# Patient Record
Sex: Female | Born: 1975 | Race: White | Hispanic: No | Smoking: Never smoker
Health system: Southern US, Community
[De-identification: ages and names within clinical notes are randomized; demographics above are authoritative.]

## PROBLEM LIST (undated history)

## (undated) DIAGNOSIS — F419 Anxiety disorder, unspecified: Secondary | ICD-10-CM

## (undated) DIAGNOSIS — R5381 Other malaise: Secondary | ICD-10-CM

## (undated) DIAGNOSIS — R002 Palpitations: Secondary | ICD-10-CM

## (undated) DIAGNOSIS — C4490 Unspecified malignant neoplasm of skin, unspecified: Secondary | ICD-10-CM

## (undated) DIAGNOSIS — R238 Other skin changes: Secondary | ICD-10-CM

## (undated) DIAGNOSIS — IMO0002 Reserved for concepts with insufficient information to code with codable children: Secondary | ICD-10-CM

## (undated) DIAGNOSIS — I7300 Raynaud's syndrome without gangrene: Secondary | ICD-10-CM

## (undated) DIAGNOSIS — O00201 Right ovarian pregnancy without intrauterine pregnancy: Secondary | ICD-10-CM

## (undated) DIAGNOSIS — R569 Unspecified convulsions: Secondary | ICD-10-CM

## (undated) DIAGNOSIS — R5383 Other fatigue: Secondary | ICD-10-CM

## (undated) DIAGNOSIS — M329 Systemic lupus erythematosus, unspecified: Secondary | ICD-10-CM

## (undated) HISTORY — DX: Unspecified malignant neoplasm of skin, unspecified: C44.90

## (undated) HISTORY — PX: WISDOM TOOTH EXTRACTION: SHX21

## (undated) HISTORY — PX: TUBAL LIGATION: SHX77

## (undated) HISTORY — DX: Right ovarian pregnancy without intrauterine pregnancy: O00.201

## (undated) HISTORY — DX: Other skin changes: R23.8

## (undated) HISTORY — DX: Other malaise: R53.83

## (undated) HISTORY — DX: Raynaud's syndrome without gangrene: I73.00

## (undated) HISTORY — DX: Other malaise: R53.81

## (undated) HISTORY — DX: Unspecified convulsions: R56.9

## (undated) HISTORY — PX: ABLATION: SHX5711

## (undated) HISTORY — DX: Anxiety disorder, unspecified: F41.9

## (undated) HISTORY — DX: Palpitations: R00.2

## (undated) HISTORY — DX: Systemic lupus erythematosus, unspecified: M32.9

## (undated) HISTORY — PX: STERIOD INJECTION: SHX5046

## (undated) HISTORY — DX: Reserved for concepts with insufficient information to code with codable children: IMO0002

## (undated) HISTORY — PX: OTHER SURGICAL HISTORY: SHX169

---

## 1998-08-01 HISTORY — PX: WISDOM TOOTH EXTRACTION: SHX21

## 2008-08-01 HISTORY — PX: ABLATION: SHX5711

## 2010-08-01 HISTORY — PX: TUBAL LIGATION: SHX77

## 2012-02-22 ENCOUNTER — Other Ambulatory Visit (HOSPITAL_COMMUNITY)
Admission: RE | Admit: 2012-02-22 | Discharge: 2012-02-22 | Disposition: A | Discharge status: Home / Self Care | Payer: BC Managed Care – PPO | Source: Ambulatory Visit | Attending: Obstetrics & Gynecology | Admitting: Obstetrics & Gynecology

## 2012-02-22 DIAGNOSIS — Z124 Encounter for screening for malignant neoplasm of cervix: Secondary | ICD-10-CM | POA: Insufficient documentation

## 2012-02-22 DIAGNOSIS — Z1151 Encounter for screening for human papillomavirus (HPV): Secondary | ICD-10-CM | POA: Insufficient documentation

## 2012-02-22 DIAGNOSIS — Z113 Encounter for screening for infections with a predominantly sexual mode of transmission: Secondary | ICD-10-CM | POA: Insufficient documentation

## 2012-04-11 DIAGNOSIS — Z9889 Other specified postprocedural states: Secondary | ICD-10-CM | POA: Insufficient documentation

## 2012-04-11 DIAGNOSIS — Z9851 Tubal ligation status: Secondary | ICD-10-CM | POA: Insufficient documentation

## 2013-09-25 ENCOUNTER — Ambulatory Visit: Payer: BC Managed Care – PPO | Admitting: Cardiovascular Disease

## 2013-10-04 ENCOUNTER — Encounter: Payer: Self-pay | Admitting: Cardiovascular Disease

## 2013-10-04 ENCOUNTER — Ambulatory Visit (INDEPENDENT_AMBULATORY_CARE_PROVIDER_SITE_OTHER): Payer: 59 | Admitting: Cardiovascular Disease

## 2013-10-04 ENCOUNTER — Encounter (INDEPENDENT_AMBULATORY_CARE_PROVIDER_SITE_OTHER): Payer: Self-pay

## 2013-10-04 ENCOUNTER — Encounter: Payer: Self-pay | Admitting: *Deleted

## 2013-10-04 VITALS — BP 118/68 | HR 64 | Ht 69.0 in | Wt 162.0 lb

## 2013-10-04 DIAGNOSIS — R002 Palpitations: Secondary | ICD-10-CM | POA: Insufficient documentation

## 2013-10-04 DIAGNOSIS — I7300 Raynaud's syndrome without gangrene: Secondary | ICD-10-CM

## 2013-10-04 DIAGNOSIS — R079 Chest pain, unspecified: Secondary | ICD-10-CM

## 2013-10-04 DIAGNOSIS — M329 Systemic lupus erythematosus, unspecified: Secondary | ICD-10-CM

## 2013-10-04 NOTE — Progress Notes (Signed)
Patient ID: Amber Lawrence, female   DOB: Feb 06, 1976, 38 y.o.   MRN: [id number]   38 yo referred for palpitations and atypical chest pain.  She has Raynauds with "poor circulation" in hands Just evaluated by El[facility] Rheum for positive ANA and apparently DSDNA Positive and she was told she has lupus  Started on plaquenil  She has fleeting sharp pains in both sides of chest and shoulder.  Has had for a long time and thought nothing of it Occasionally related to gas.  Palpitations are intermitant not weekly  Flips but no sustained tachycardia.  Denies fever, pleurisy edema.  No ulcers hands but they are sensitive to cold And turn red / white and blue    ROS: Denies fever, malais, weight loss, blurry vision, decreased visual acuity, cough, sputum, SOB, hemoptysis, pleuritic pain, palpitaitons, heartburn, abdominal pain, melena, lower extremity edema, claudication, or rash.  All other systems reviewed and negative   General: Affect appropriate Healthy:  appears stated age 38: normal Neck supple with no adenopathy JVP normal no bruits no thyromegaly Lungs clear with no wheezing and good diaphragmatic motion Heart:  S1/S2 no murmur,rub, gallop or click PMI normal Abdomen: benighn, BS positve, no tenderness, no AAA no bruit.  No HSM or HJR Distal pulses intact with no bruits No edema Neuro non-focal Skin warm and dry No muscular weakness  Medications Current Outpatient Prescriptions  Medication Sig Dispense Refill  . hydroxychloroquine (PLAQUENIL) 200 MG tablet Take 1 tablet by mouth 2 (two) times daily.       No current facility-administered medications for this visit.    Allergies Review of patient's allergies indicates not on file.  Family History: Family History  Problem Relation Age of Onset  . Heart Problems      grandmother    Social History: History   Social History  . Marital Status: Unknown    Spouse Name: N/A    Number of Children: N/A  . Years of Education: N/A    Occupational History  . Not on file.   Social History Main Topics  . Smoking status: Never Smoker   . Smokeless tobacco: Not on file  . Alcohol Use: Yes  . Drug Use: No  . Sexual Activity: Not on file   Other Topics Concern  . Not on file   Social History Narrative  . No narrative on file    Electrocardiogram:  SR rate 64  Normal ECG   Assessment and Plan

## 2013-10-04 NOTE — Patient Instructions (Signed)

## 2013-10-04 NOTE — Assessment & Plan Note (Signed)
Discussed care of hands and wearing gloves  Can consider procardia if needed in future

## 2013-10-04 NOTE — Assessment & Plan Note (Signed)
F/U Rheum started on plaquenil  Follow Cr for nephritis  Echo to r/o pericardial disease

## 2013-10-04 NOTE — Assessment & Plan Note (Signed)
Atypical  No need for ETT with normal ECG  With lupus would get echo to r/o pericardial disease

## 2013-10-22 ENCOUNTER — Ambulatory Visit (HOSPITAL_COMMUNITY): Payer: 59 | Attending: Cardiovascular Disease | Admitting: Radiology

## 2013-10-22 DIAGNOSIS — M329 Systemic lupus erythematosus, unspecified: Secondary | ICD-10-CM

## 2013-10-22 DIAGNOSIS — R002 Palpitations: Secondary | ICD-10-CM | POA: Insufficient documentation

## 2013-10-22 DIAGNOSIS — R072 Precordial pain: Secondary | ICD-10-CM

## 2013-10-22 NOTE — Progress Notes (Signed)
Echocardiogram Performed. 

## 2020-10-26 ENCOUNTER — Telehealth: Payer: Self-pay

## 2020-10-26 NOTE — Telephone Encounter (Signed)
Pt left voicemail asking office to call her back and confirm the date/time of her appt. Returned pt call and confirmed appt date of 3/29 at 10:10 am. Pt expressed understanding.

## 2020-10-27 ENCOUNTER — Other Ambulatory Visit (HOSPITAL_COMMUNITY)
Admission: RE | Admit: 2020-10-27 | Discharge: 2020-10-27 | Disposition: A | Discharge status: Home / Self Care | Payer: 59 | Source: Ambulatory Visit | Attending: Obstetrics and Gynecology | Admitting: Obstetrics and Gynecology

## 2020-10-27 ENCOUNTER — Encounter: Payer: Self-pay | Admitting: Obstetrics and Gynecology

## 2020-10-27 ENCOUNTER — Other Ambulatory Visit: Payer: Self-pay

## 2020-10-27 ENCOUNTER — Ambulatory Visit (INDEPENDENT_AMBULATORY_CARE_PROVIDER_SITE_OTHER): Payer: 59 | Admitting: Obstetrics and Gynecology

## 2020-10-27 VITALS — BP 135/73 | HR 51 | Ht 69.5 in | Wt 169.0 lb

## 2020-10-27 DIAGNOSIS — Z01419 Encounter for gynecological examination (general) (routine) without abnormal findings: Secondary | ICD-10-CM | POA: Diagnosis present

## 2020-10-27 NOTE — Progress Notes (Signed)
GYNECOLOGY ANNUAL PREVENTATIVE CARE ENCOUNTER NOTE  History:     Amber Lawrence is a 45 y.o. G63P2002 female here for a routine annual gynecologic exam.  Current complaints: none.   Denies abnormal vaginal bleeding, discharge, pelvic pain, problems with intercourse or other gynecologic concerns.   Pronouns: She/her/hers  Gynecologic History Patient's last menstrual period was 10/09/2020. Contraception: tubal ligation/ablasion  Last Pap: 2018 Results were: normal with negative HPV Last mammogram: 2022. Results were: normal  Obstetric History OB History  Gravida Para Term Preterm AB Living  2 2 2     2   SAB IAB Ectopic Multiple Live Births               # Outcome Date GA Lbr Len/2nd Weight Sex Delivery Anes PTL Lv  2 Term           1 Term             Past Medical History:  Diagnosis Date  . Ectopic pregnancy of right ovary   . Other malaise and fatigue   . Other symptoms involving skin and integumentary tissues   . Palpitations   . Raynaud's syndrome   . Skin cancer     Past Surgical History:  Procedure Laterality Date  . ABLATION     Uterine  . right salpingectomy    . TUBAL LIGATION    . WISDOM TOOTH EXTRACTION      No current outpatient medications on file prior to visit.   No current facility-administered medications on file prior to visit.    No Known Allergies  Social History:  reports that she has never smoked. She has never used smokeless tobacco. She reports current alcohol use. She reports that she does not use drugs.  Family History  Problem Relation Age of Onset  . Heart Problems Other        grandmother  . Heart disease Paternal Grandfather   . Heart attack Paternal Grandfather     The following portions of the patient's history were reviewed and updated as appropriate: allergies, current medications, past family history, past medical history, past social history, past surgical history and problem list.  Review of Systems Pertinent items  noted in HPI and remainder of comprehensive ROS otherwise negative.  Physical Exam:  BP 135/73   Pulse (!) 51   Ht 5' 9.5" (1.765 m)   Wt 169 lb (76.7 kg)   LMP 10/09/2020   BMI 24.60 kg/m  CONSTITUTIONAL: Well-developed, well-nourished female in no acute distress.  HENT:  Normocephalic, atraumatic, External right and left ear normal.  EYES: Conjunctivae and EOM are normal. Pupils are equal, round, and reactive to light. No scleral icterus.  NECK: Normal range of motion, supple, no masses.  Normal thyroid.  SKIN: Skin is warm and dry. No rash noted. Not diaphoretic. No erythema. No pallor. MUSCULOSKELETAL: Normal range of motion. No tenderness.  No cyanosis, clubbing, or edema. NEUROLOGIC: Alert and oriented to person, place, and time. Normal reflexes, muscle tone coordination.  PSYCHIATRIC: Normal mood and affect. Normal behavior. Normal judgment and thought content. CARDIOVASCULAR: Normal heart rate noted, regular rhythm RESPIRATORY: Clear to auscultation bilaterally. Effort and breath sounds normal, no problems with respiration noted. BREASTS: Symmetric in size. No masses, tenderness, skin changes, nipple drainage, or lymphadenopathy bilaterally. Performed in the presence of a chaperone. ABDOMEN: Soft, no distention noted.  No tenderness, rebound or guarding.  PELVIC: Normal appearing external genitalia and urethral meatus; normal appearing vaginal mucosa and cervix.  No  abnormal discharge noted.  Pap smear obtained.  Normal uterine size, no other palpable masses, no uterine or adnexal tenderness.  Performed in the presence of a chaperone.   Assessment and Plan:    1. Well woman exam with routine gynecological exam  - Cytology - PAP( [place]) - MM Digital Screening; Future  Will follow up results of pap smear and manage accordingly. Mammogram scheduled Routine preventative health maintenance measures emphasized. Please refer to After Visit Summary for other counseling  recommendations.    Andree Golphin, Artist Pais, [place] for [employer], [place]

## 2020-10-28 LAB — CYTOLOGY - PAP
Comment: NEGATIVE
Diagnosis: NEGATIVE
High risk HPV: NEGATIVE

## 2020-11-04 ENCOUNTER — Ambulatory Visit: Payer: 59

## 2020-11-12 ENCOUNTER — Other Ambulatory Visit: Payer: Self-pay | Admitting: Obstetrics and Gynecology

## 2020-11-12 ENCOUNTER — Ambulatory Visit (INDEPENDENT_AMBULATORY_CARE_PROVIDER_SITE_OTHER): Payer: 59

## 2020-11-12 ENCOUNTER — Other Ambulatory Visit: Payer: Self-pay

## 2020-11-12 DIAGNOSIS — Z1231 Encounter for screening mammogram for malignant neoplasm of breast: Secondary | ICD-10-CM

## 2020-11-12 DIAGNOSIS — Z01419 Encounter for gynecological examination (general) (routine) without abnormal findings: Secondary | ICD-10-CM

## 2020-11-15 ENCOUNTER — Other Ambulatory Visit: Payer: Self-pay | Admitting: Obstetrics and Gynecology

## 2020-11-15 DIAGNOSIS — R928 Other abnormal and inconclusive findings on diagnostic imaging of breast: Secondary | ICD-10-CM

## 2020-11-15 NOTE — Progress Notes (Signed)
Hi,  Korey had an abnormal screening MAMMO and they recommended further evaluation with a diagnostic mammogram. I have placed the order. Can you see that she gets this scheduled?    Thank you so much,  Anderson Malta, NP

## 2020-11-16 ENCOUNTER — Other Ambulatory Visit: Payer: Self-pay | Admitting: Obstetrics and Gynecology

## 2020-11-16 DIAGNOSIS — R928 Other abnormal and inconclusive findings on diagnostic imaging of breast: Secondary | ICD-10-CM

## 2020-12-07 ENCOUNTER — Ambulatory Visit: Payer: 59

## 2020-12-07 ENCOUNTER — Ambulatory Visit
Admission: RE | Admit: 2020-12-07 | Discharge: 2020-12-07 | Disposition: A | Discharge status: Home / Self Care | Payer: 59 | Source: Ambulatory Visit | Attending: Obstetrics and Gynecology | Admitting: Obstetrics and Gynecology

## 2020-12-07 ENCOUNTER — Other Ambulatory Visit: Payer: Self-pay | Admitting: Obstetrics and Gynecology

## 2020-12-07 ENCOUNTER — Other Ambulatory Visit: Payer: Self-pay

## 2020-12-07 DIAGNOSIS — R921 Mammographic calcification found on diagnostic imaging of breast: Secondary | ICD-10-CM

## 2020-12-07 DIAGNOSIS — R928 Other abnormal and inconclusive findings on diagnostic imaging of breast: Secondary | ICD-10-CM

## 2021-03-12 ENCOUNTER — Other Ambulatory Visit: Payer: Self-pay

## 2021-03-12 ENCOUNTER — Encounter: Payer: Self-pay | Admitting: Medical-Surgical

## 2021-03-12 ENCOUNTER — Ambulatory Visit: Payer: 59 | Admitting: Medical-Surgical

## 2021-03-12 VITALS — BP 128/70 | HR 53 | Resp 20 | Ht 69.5 in | Wt 162.0 lb

## 2021-03-12 DIAGNOSIS — F430 Acute stress reaction: Secondary | ICD-10-CM

## 2021-03-12 DIAGNOSIS — F902 Attention-deficit hyperactivity disorder, combined type: Secondary | ICD-10-CM

## 2021-03-12 DIAGNOSIS — G4700 Insomnia, unspecified: Secondary | ICD-10-CM

## 2021-03-12 DIAGNOSIS — Z1329 Encounter for screening for other suspected endocrine disorder: Secondary | ICD-10-CM | POA: Diagnosis not present

## 2021-03-12 DIAGNOSIS — Z0000 Encounter for general adult medical examination without abnormal findings: Secondary | ICD-10-CM | POA: Diagnosis not present

## 2021-03-12 DIAGNOSIS — Z7689 Persons encountering health services in other specified circumstances: Secondary | ICD-10-CM | POA: Diagnosis not present

## 2021-03-12 DIAGNOSIS — Z113 Encounter for screening for infections with a predominantly sexual mode of transmission: Secondary | ICD-10-CM

## 2021-03-12 DIAGNOSIS — R5383 Other fatigue: Secondary | ICD-10-CM | POA: Diagnosis not present

## 2021-03-12 MED ORDER — LISDEXAMFETAMINE DIMESYLATE 30 MG PO CAPS: 30.0000 mg | ORAL_CAPSULE | Freq: Every day | ORAL | 0 refills | Status: DC

## 2021-03-12 MED ORDER — QUETIAPINE FUMARATE 25 MG PO TABS: 25.0000 mg | ORAL_TABLET | Freq: Every day | ORAL | 0 refills | Status: DC

## 2021-03-12 NOTE — Progress Notes (Signed)
New Patient Office Visit  Subjective:  Patient ID: Amber Lawrence, female    DOB: 09-29-1975  Age: 45 y.o. MRN: [id number]  CC: No chief complaint on file.   HPI Amber Lawrence presents to establish care.  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  ADHD-was diagnosed in childhood and has several years where she was medicated.  She is not sure what her medication was at the time but as she grew older, she began to learn to manage with lifestyle modifications.  She has not been treated for several years but has recently started a new job that is very sedentary.  Admits to trying a friend's Vyvanse a couple of times and it really helped her focus and get her work done.  She is very interested in getting restarted on a medication to help manage her symptoms.  Lupus/fatigue-she was diagnosed with lupus in 2016 and was medicated for a short period of time.  She did not want to stay on medications long-term so she worked to increase her exercise and modify her diet.  After 9 months of lifestyle modifications, she went back and was told that she no longer needed medication.  Over the past months she has noticed that her fatigue has gotten worse and is "bone deep".  She would like to have blood work done to make sure all is well and this is simply a response to her current lifestyle changes and stress levels.  Mood/insomnia-is currently dealing with quite a lot of stress in her life.  She has dealt for many years with custody issues when she split from her first husband.  About the time those issues got settled, her current husband of 10 years decided he wanted out of their marriage.  She is now dealing with being a single mom and is struggling to make it work.  She has new job which has caused quite a bit of stress with the change.  She has been doing counseling for years which has been very helpful but with her new schedule, she has difficulty finding time to get in there for appointments.   She has never taken antidepressants and is very hesitant to consider starting one.  She is having difficulty sleeping at night where she cannot shut her mind off.  She wakes every couple of hours and has difficulty getting back to sleep due to racing thoughts.  Would like to be tested for STIs as she recently found out that her husband had been seeing someone.  Past Medical History:  Diagnosis Date   Ectopic pregnancy of right ovary    Lupus ([place])    Other malaise and fatigue    Other symptoms involving skin and integumentary tissues    Palpitations    Raynaud's syndrome    Skin cancer     Past Surgical History:  Procedure Laterality Date   ABLATION     Uterine   right salpingectomy     TUBAL LIGATION     WISDOM TOOTH EXTRACTION      Family History  Problem Relation Age of Onset   Heart Problems Other        grandmother   Heart disease Paternal Grandfather    Heart attack Paternal Grandfather     Social History   Socioeconomic History   Marital status: Legally Separated    Spouse name: Not on file   Number of children: Not on file   Years of education: Not on file  Highest education level: Not on file  Occupational History   Not on file  Tobacco Use   Smoking status: Never   Smokeless tobacco: Never  Vaping Use   Vaping Use: Never used  Substance and Sexual Activity   Alcohol use: Yes   Drug use: No   Sexual activity: Not Currently    Birth control/protection: Surgical  Other Topics Concern   Not on file  Social History Narrative   Not on file   Social Determinants of Health   Financial Resource Strain: Not on file  Food Insecurity: Not on file  Transportation Needs: Not on file  Physical Activity: Not on file  Stress: Not on file  Social Connections: Not on file  Intimate Partner Violence: Not on file    ROS Review of Systems  Objective:   Today's Vitals: BP 128/70 (BP Location: Left Arm, Patient Position: Sitting, Cuff Size: Normal)    Pulse (!) 53   Resp 20   Ht 5' 9.5" (1.765 m)   Wt 162 lb (73.5 kg)   SpO2 98%   BMI 23.58 kg/m   Physical Exam Vitals reviewed.  Constitutional:      General: She is not in acute distress.    Appearance: Normal appearance. She is normal weight. She is not ill-appearing.  HENT:     Head: Normocephalic and atraumatic.  Cardiovascular:     Rate and Rhythm: Normal rate and regular rhythm.     Pulses: Normal pulses.     Heart sounds: Normal heart sounds. No murmur heard.   No friction rub. No gallop.  Pulmonary:     Effort: Pulmonary effort is normal. No respiratory distress.     Breath sounds: Normal breath sounds. No wheezing.  Skin:    General: Skin is warm and dry.  Neurological:     Mental Status: She is alert and oriented to person, place, and time.  Psychiatric:        Attention and Perception: Attention and perception normal.        Mood and Affect: Mood normal.        Speech: Speech normal.        Behavior: Behavior normal. Behavior is cooperative.        Thought Content: Thought content normal.        Cognition and Memory: Cognition and memory normal.        Judgment: Judgment normal.    Assessment & Plan:   1. Encounter to establish care Reviewed available information and discussed healthcare concerns with patient.  2. Preventative health care CBC with differential, CMP, and lipid panel today. - CBC with Differential/Platelet - COMPLETE METABOLIC PANEL WITH GFR - Lipid panel  3. Thyroid disorder screen Checking TSH today. - TSH  4. Fatigue, unspecified type Checking iron panel, vitamin D, vitamin B12, and folate panel today. - Fe+TIBC+Fer - VITAMIN D 25 Hydroxy (Vit-D Deficiency, Fractures) - B12 and Folate Panel  5. Routine screening for STI (sexually transmitted infection) STI testing as below. - HIV Antibody (routine testing w rflx) - Hepatitis C Antibody - Hepatitis B surface antigen - RPR - C. trachomatis/N. gonorrhoeae RNA - WET PREP FOR  [place], YEAST, CLUE  6. Attention deficit hyperactivity disorder (ADHD), combined type Would like to have her records for formal diagnosis but since this was diagnosed during her early school years, this is likely not going to be available.  She was treated in the past with ADHD medications with good results so we are starting  Vyvanse 30 mg daily.  7. Mixed insomnia 8. Stress reaction Unfortunately, a lot of her anxieties/stress are related to situational issues.  I do think that she would benefit from getting better sleep at night as well as some mood stabilization.  Starting Seroquel 25 mg at bedtime.  Outpatient Encounter Medications as of 03/12/2021  Medication Sig   lisdexamfetamine (VYVANSE) 30 MG capsule Take 1 capsule (30 mg total) by mouth daily.   QUEtiapine (SEROQUEL) 25 MG tablet Take 1 tablet (25 mg total) by mouth at bedtime.   No facility-administered encounter medications on file as of 03/12/2021.    Follow-up: Return in about 4 weeks (around 04/09/2021) for ADHD/mood/insomnia follow up.   Clearnce Sorrel, DNP, APRN, FNP-BC [facility]

## 2021-03-13 LAB — CBC WITH DIFFERENTIAL/PLATELET
Absolute Monocytes: 574 cells/uL (ref 200–950)
Basophils Absolute: 59 cells/uL (ref 0–200)
Basophils Relative: 0.9 %
Eosinophils Absolute: 158 cells/uL (ref 15–500)
Eosinophils Relative: 2.4 %
HCT: 45.1 % — ABNORMAL HIGH (ref 35.0–45.0)
Hemoglobin: 14.6 g/dL (ref 11.7–15.5)
Lymphs Abs: 2099 cells/uL (ref 850–3900)
MCH: 30.6 pg (ref 27.0–33.0)
MCHC: 32.4 g/dL (ref 32.0–36.0)
MCV: 94.5 fL (ref 80.0–100.0)
MPV: 11.2 fL (ref 7.5–12.5)
Monocytes Relative: 8.7 %
Neutro Abs: 3709 cells/uL (ref 1500–7800)
Neutrophils Relative %: 56.2 %
Platelets: 222 10*3/uL (ref 140–400)
RBC: 4.77 10*6/uL (ref 3.80–5.10)
RDW: 11.6 % (ref 11.0–15.0)
Total Lymphocyte: 31.8 %
WBC: 6.6 10*3/uL (ref 3.8–10.8)

## 2021-03-13 LAB — COMPLETE METABOLIC PANEL WITH GFR
AG Ratio: 1.6 (calc) (ref 1.0–2.5)
ALT: 18 U/L (ref 6–29)
AST: 19 U/L (ref 10–30)
Albumin: 4.5 g/dL (ref 3.6–5.1)
Alkaline phosphatase (APISO): 40 U/L (ref 31–125)
BUN/Creatinine Ratio: 28 (calc) — ABNORMAL HIGH (ref 6–22)
BUN: 26 mg/dL — ABNORMAL HIGH (ref 7–25)
CO2: 26 mmol/L (ref 20–32)
Calcium: 9.7 mg/dL (ref 8.6–10.2)
Chloride: 102 mmol/L (ref 98–110)
Creat: 0.92 mg/dL (ref 0.50–0.99)
Globulin: 2.9 g/dL (calc) (ref 1.9–3.7)
Glucose, Bld: 85 mg/dL (ref 65–99)
Potassium: 4.1 mmol/L (ref 3.5–5.3)
Sodium: 137 mmol/L (ref 135–146)
Total Bilirubin: 0.5 mg/dL (ref 0.2–1.2)
Total Protein: 7.4 g/dL (ref 6.1–8.1)
eGFR: 79 mL/min/{1.73_m2} (ref >=60)

## 2021-03-13 LAB — WET PREP FOR TRICH, YEAST, CLUE
MICRO NUMBER:: 12236533
Specimen Quality: ADEQUATE

## 2021-03-13 LAB — LIPID PANEL
Cholesterol: 197 mg/dL (ref <200)
HDL: 88 mg/dL (ref >=50)
LDL Cholesterol (Calc): 98 mg/dL (calc)
Non-HDL Cholesterol (Calc): 109 mg/dL (calc) (ref <130)
Total CHOL/HDL Ratio: 2.2 (calc) (ref <5.0)
Triglycerides: 36 mg/dL (ref <150)

## 2021-03-13 LAB — TSH: TSH: 1.61 mIU/L

## 2021-03-14 ENCOUNTER — Other Ambulatory Visit: Payer: Self-pay | Admitting: Medical-Surgical

## 2021-03-14 MED ORDER — METRONIDAZOLE 500 MG PO TABS: 500.0000 mg | ORAL_TABLET | Freq: Two times a day (BID) | ORAL | 0 refills | Status: AC

## 2021-03-15 LAB — C. TRACHOMATIS/N. GONORRHOEAE RNA
C. trachomatis RNA, TMA: NOT DETECTED
N. gonorrhoeae RNA, TMA: NOT DETECTED

## 2021-03-15 LAB — HIV ANTIBODY (ROUTINE TESTING W REFLEX): HIV 1&2 Ab, 4th Generation: NONREACTIVE

## 2021-03-15 LAB — IRON,TIBC AND FERRITIN PANEL
%SAT: 28 % (calc) (ref 16–45)
Ferritin: 153 ng/mL (ref 16–232)
Iron: 91 ug/dL (ref 40–190)
TIBC: 323 mcg/dL (calc) (ref 250–450)

## 2021-03-15 LAB — VITAMIN D 25 HYDROXY (VIT D DEFICIENCY, FRACTURES): Vit D, 25-Hydroxy: 43 ng/mL (ref 30–100)

## 2021-03-15 LAB — HEPATITIS B SURFACE ANTIGEN: Hepatitis B Surface Ag: NONREACTIVE

## 2021-03-15 LAB — RPR: RPR Ser Ql: NONREACTIVE

## 2021-03-15 LAB — B12 AND FOLATE PANEL
Folate: 11.7 ng/mL
Vitamin B-12: 583 pg/mL (ref 200–1100)

## 2021-03-15 LAB — HEPATITIS C ANTIBODY
Hepatitis C Ab: NONREACTIVE
SIGNAL TO CUT-OFF: 0.01 (ref <1.00)

## 2021-04-09 ENCOUNTER — Telehealth: Payer: 59 | Admitting: Medical-Surgical

## 2021-04-10 ENCOUNTER — Other Ambulatory Visit: Payer: Self-pay | Admitting: Medical-Surgical

## 2021-04-23 ENCOUNTER — Encounter: Payer: Self-pay | Admitting: Medical-Surgical

## 2021-04-23 ENCOUNTER — Other Ambulatory Visit: Payer: Self-pay | Admitting: Medical-Surgical

## 2021-04-23 MED ORDER — LISDEXAMFETAMINE DIMESYLATE 30 MG PO CAPS: 30.0000 mg | ORAL_CAPSULE | Freq: Every day | ORAL | 0 refills | Status: DC

## 2021-04-26 ENCOUNTER — Other Ambulatory Visit: Payer: Self-pay | Admitting: Medical-Surgical

## 2021-04-27 ENCOUNTER — Encounter: Payer: Self-pay | Admitting: Medical-Surgical

## 2021-04-27 NOTE — Telephone Encounter (Signed)
Please refuse this RX refill.  Joy sent it to the pharmacy on Friday.  I do not have security to deny the refill.  Charyl Bigger, CMA

## 2021-05-05 ENCOUNTER — Telehealth (INDEPENDENT_AMBULATORY_CARE_PROVIDER_SITE_OTHER): Payer: 59 | Admitting: Medical-Surgical

## 2021-05-05 ENCOUNTER — Encounter: Payer: Self-pay | Admitting: Medical-Surgical

## 2021-05-05 VITALS — Wt 162.0 lb

## 2021-05-05 DIAGNOSIS — G4700 Insomnia, unspecified: Secondary | ICD-10-CM | POA: Diagnosis not present

## 2021-05-05 DIAGNOSIS — F902 Attention-deficit hyperactivity disorder, combined type: Secondary | ICD-10-CM

## 2021-05-05 DIAGNOSIS — R1084 Generalized abdominal pain: Secondary | ICD-10-CM

## 2021-05-05 MED ORDER — LISDEXAMFETAMINE DIMESYLATE 40 MG PO CAPS: 40.0000 mg | ORAL_CAPSULE | ORAL | 0 refills | Status: DC

## 2021-05-05 NOTE — Progress Notes (Signed)
Virtual Visit via Video Note  I connected with Amber Lawrence on 05/05/21 at  2:00 PM EDT by a video enabled telemedicine application and verified that I am speaking with the correct person using two identifiers.   I discussed the limitations of evaluation and management by telemedicine and the availability of in person appointments. The patient expressed understanding and agreed to proceed.  Patient location: home Provider locations: office  Subjective:    CC: ADHD follow-up, good health questions  HPI: Pleasant 45 year old female presenting via [place] video visit for the following:  ADHD-taking Vyvanse 30 mg daily, tolerating well without side effects.  No significant changes in appetite or weight.  Sleeping well with the help of Seroquel.  Feels that the 30 mg dose does help but her focus is not quite where she would like it to be.  Wonders if she would do better with a little bit of a higher dose.  Has had a history of some intermittent abdominal pain and has recently come to the conclusion that this is not a normal part of GI activity.  Notes worsening issues when it comes to eating things with red sauce, tomatoes, red meat, and acidic foods.  This happens intermittently and not every time she gets these foods.  She has bowel movements on average twice a week or less but about a month ago, she started a probiotic as well as using MiraLAX daily as needed.  Now she is having 4-5 bowel movements per week which has been very helpful.  She wants to make sure that taking MiraLAX and the probiotic is okay or if there are other recommendations that may be beneficial.  Past medical history, Surgical history, Family history not pertinant except as noted below, Social history, Allergies, and medications have been entered into the medical record, reviewed, and corrections made.   Review of Systems: See HPI for pertinent positives and negatives.   Objective:    General: Speaking clearly in complete  sentences without any shortness of breath.  Alert and oriented x3.  Normal judgment. No apparent acute distress.  Impression and Recommendations:    1. Attention deficit hyperactivity disorder (ADHD), combined type Increasing Vyvanse to 40 mg daily.  Canceling out her 30 mg prescription at the pharmacy.  Advised patient to try the 40 mg daily for the next few weeks and I will check in with her via MyChart.  If she does well on this dose, I will be happy to send in another 12-month supply so we can follow-up in 3 months as planned.  2. Mixed insomnia Continue Seroquel 25 mg nightly as needed.  3. Abdominal discomfort, generalized Suspect there is a bit of reflux is causing her sensitivities to certain foods.  Avoid known food triggers and if necessary, okay to trial over-the-counter antacids.  Recommend continuing probiotic.  Okay to use MiraLAX 17 g daily as needed.  Consider adding a fiber supplements on a daily basis as this may help with constipation and reduce the need for MiraLAX.  I discussed the assessment and treatment plan with the patient. The patient was provided an opportunity to ask questions and all were answered. The patient agreed with the plan and demonstrated an understanding of the instructions.   The patient was advised to call back or seek an in-person evaluation if the symptoms worsen or if the condition fails to improve as anticipated.  20 minutes of non-face-to-face time was provided during this encounter.  Return in about 3 months (around 08/05/2021)  for ADHD follow up.  Clearnce Sorrel, DNP, APRN, FNP-BC [facility]

## 2021-05-05 NOTE — Progress Notes (Signed)
Gut health, taking daily probiotic, Miralax Seroquel helping a lot Vyvanse doing ok, would like to up dose

## 2021-05-07 ENCOUNTER — Other Ambulatory Visit: Payer: Self-pay | Admitting: Medical-Surgical

## 2021-05-12 ENCOUNTER — Encounter: Payer: Self-pay | Admitting: Medical-Surgical

## 2021-05-12 MED ORDER — QUETIAPINE FUMARATE 25 MG PO TABS: 25.0000 mg | ORAL_TABLET | Freq: Every day | ORAL | 0 refills | Status: DC

## 2021-05-25 ENCOUNTER — Telehealth (INDEPENDENT_AMBULATORY_CARE_PROVIDER_SITE_OTHER): Payer: 59 | Admitting: Medical-Surgical

## 2021-05-25 ENCOUNTER — Encounter: Payer: Self-pay | Admitting: Medical-Surgical

## 2021-05-25 DIAGNOSIS — F902 Attention-deficit hyperactivity disorder, combined type: Secondary | ICD-10-CM | POA: Insufficient documentation

## 2021-05-25 MED ORDER — LISDEXAMFETAMINE DIMESYLATE 40 MG PO CAPS: 40.0000 mg | ORAL_CAPSULE | ORAL | 0 refills | Status: DC

## 2021-05-25 NOTE — Progress Notes (Signed)
Virtual Visit via Video Note  I connected with Amber Lawrence on 05/25/21 at  2:00 PM EDT by a video enabled telemedicine application and verified that I am speaking with the correct person using two identifiers.   I discussed the limitations of evaluation and management by telemedicine and the availability of in person appointments. The patient expressed understanding and agreed to proceed.  Patient location: home Provider locations: office  Subjective:    CC: ADHD follow up   HPI: Pleasant 45 year old female presenting via [place] video visit for follow-up on ADHD.  Approximately 4 weeks ago we increased her Vyvanse to 40 mg daily.  She has been taking this as prescribed, tolerating well without side effects.  Has not noticed much of a difference at the 40 mg dose compared to the 30 mg dose.  Feels that it is working well for her and she is staying focused throughout the day.  She does still have some times when she feels like there is room for improvement in her focus but admits that this may be her personality rather than attention deficit related.  Using Seroquel 25 mg at night as needed to help with sleep with good results.  No changes in appetite or weight fluctuations.  Past medical history, Surgical history, Family history not pertinant except as noted below, Social history, Allergies, and medications have been entered into the medical record, reviewed, and corrections made.   Review of Systems: See HPI for pertinent positives and negatives.   Objective:    General: Speaking clearly in complete sentences without any shortness of breath.  Alert and oriented x3.  Normal judgment. No apparent acute distress.  Impression and Recommendations:    1. Attention deficit hyperactivity disorder (ADHD), combined type Continue Vyvanse 40 mg daily.  44-month supply sent to the pharmacy.  If she would like to reevaluate her dose, she is welcome to reach out to me for further recommendations  I  discussed the assessment and treatment plan with the patient. The patient was provided an opportunity to ask questions and all were answered. The patient agreed with the plan and demonstrated an understanding of the instructions.   The patient was advised to call back or seek an in-person evaluation if the symptoms worsen or if the condition fails to improve as anticipated.  20 minutes of non-face-to-face time was provided during this encounter.  Return in about 3 months (around 08/25/2021) for ADHD follow up.  Clearnce Sorrel, DNP, APRN, FNP-BC [facility]

## 2021-07-07 ENCOUNTER — Encounter: Payer: Self-pay | Admitting: Medical-Surgical

## 2021-07-07 ENCOUNTER — Other Ambulatory Visit: Payer: Self-pay | Admitting: Medical-Surgical

## 2021-07-07 MED ORDER — QUETIAPINE FUMARATE 25 MG PO TABS: 25.0000 mg | ORAL_TABLET | Freq: Every day | ORAL | 0 refills | Status: DC

## 2021-07-12 ENCOUNTER — Encounter: Payer: Self-pay | Admitting: Medical-Surgical

## 2021-07-12 ENCOUNTER — Telehealth (INDEPENDENT_AMBULATORY_CARE_PROVIDER_SITE_OTHER): Payer: 59 | Admitting: Medical-Surgical

## 2021-07-12 ENCOUNTER — Other Ambulatory Visit: Payer: Self-pay

## 2021-07-12 DIAGNOSIS — F902 Attention-deficit hyperactivity disorder, combined type: Secondary | ICD-10-CM

## 2021-07-12 MED ORDER — LISDEXAMFETAMINE DIMESYLATE 50 MG PO CAPS: 50.0000 mg | ORAL_CAPSULE | Freq: Every day | ORAL | 0 refills | Status: DC

## 2021-07-12 NOTE — Progress Notes (Signed)
Virtual Visit via Video Note  I connected with Amber Lawrence on 07/12/21 at  3:00 PM EST by a video enabled telemedicine application and verified that I am speaking with the correct person using two identifiers.   I discussed the limitations of evaluation and management by telemedicine and the availability of in person appointments. The patient expressed understanding and agreed to proceed.  Patient location: home Provider locations: office  Subjective:    CC: ADHD follow up  HPI: Pleasant 45 year old female presenting via [place] video visit today for follow-up on ADHD.  She has been doing well on Vyvanse 40 mg daily but notes that she feels like the medication could work just a bit better.  She is interested in increasing the dose to the next level, especially with the holidays coming around.  Does note that the medication causes appetite suppression but has been working around this by eating heavier in the morning and snacking throughout the day to make sure she gets her protein in.  Using Seroquel at night to help with sleep which has been very beneficial.  Past medical history, Surgical history, Family history not pertinant except as noted below, Social history, Allergies, and medications have been entered into the medical record, reviewed, and corrections made.   Review of Systems: See HPI for pertinent positives and negatives.   Objective:    General: Speaking clearly in complete sentences without any shortness of breath.  Alert and oriented x3.  Normal judgment. No apparent acute distress.  Impression and Recommendations:    1. Attention deficit hyperactivity disorder (ADHD), combined type Increasing Vyvanse to 50 mg daily.  I will check in with her via MyChart in the next 3 to 4 weeks to see how the increased dose is working for her. Continue eating regularly. Ok to continue Seroquel for sleep and mood.   I discussed the assessment and treatment plan with the patient. The patient  was provided an opportunity to ask questions and all were answered. The patient agreed with the plan and demonstrated an understanding of the instructions.   The patient was advised to call back or seek an in-person evaluation if the symptoms worsen or if the condition fails to improve as anticipated.  20 minutes of non-face-to-face time was provided during this encounter.  Follow up: Will follow up with her via MyChart in 3-4 weeks.   Clearnce Sorrel, DNP, APRN, FNP-BC [facility]

## 2021-08-05 ENCOUNTER — Encounter: Payer: Self-pay | Admitting: Medical-Surgical

## 2021-08-30 ENCOUNTER — Other Ambulatory Visit: Payer: Self-pay | Admitting: Medical-Surgical

## 2021-08-30 MED ORDER — LISDEXAMFETAMINE DIMESYLATE 50 MG PO CAPS: 50.0000 mg | ORAL_CAPSULE | Freq: Every day | ORAL | 0 refills | Status: DC

## 2021-08-31 ENCOUNTER — Ambulatory Visit (INDEPENDENT_AMBULATORY_CARE_PROVIDER_SITE_OTHER): Payer: BC Managed Care – PPO

## 2021-08-31 ENCOUNTER — Ambulatory Visit: Payer: BC Managed Care – PPO | Admitting: Sports Medicine

## 2021-08-31 ENCOUNTER — Other Ambulatory Visit: Payer: Self-pay

## 2021-08-31 DIAGNOSIS — S6991XA Unspecified injury of right wrist, hand and finger(s), initial encounter: Secondary | ICD-10-CM | POA: Insufficient documentation

## 2021-08-31 DIAGNOSIS — S61212A Laceration without foreign body of right middle finger without damage to nail, initial encounter: Secondary | ICD-10-CM | POA: Diagnosis not present

## 2021-08-31 NOTE — Assessment & Plan Note (Addendum)
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 She has good motion, good strength, no evidence of rotational deformity, flexor digitorum profundus, superficialis and lumbricals intact, extensors intact, collaterals stable. I would like some x-rays, I buddy taped her third and fourth fingers together. Home conditioning given, avoid resistance with the right hand for the next 2 to 3 weeks, return to see me in 3 weeks.

## 2021-08-31 NOTE — Progress Notes (Signed)
° ° °  Procedures performed today:    None.  Independent interpretation of notes and tests performed by another provider:   None.  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 She has good motion, good strength, no evidence of rotational deformity, flexor digitorum profundus, superficialis and lumbricals intact, extensors intact, collaterals stable. I would like some x-rays, I buddy taped her third and fourth fingers together. Home conditioning given, avoid resistance with the right hand for the next 2 to 3 weeks, return to see me in 3 weeks.    ___________________________________________ Gwen Her. Dianah Field, M.D., ABFM., CAQSM. Primary Care and [place] Instructor of [place] of [facility] of Medicine

## 2021-09-01 ENCOUNTER — Encounter: Payer: Self-pay | Admitting: Medical-Surgical

## 2021-09-01 ENCOUNTER — Encounter: Payer: Self-pay | Admitting: Sports Medicine

## 2021-09-13 ENCOUNTER — Telehealth: Payer: BC Managed Care – PPO | Admitting: Medical-Surgical

## 2021-09-13 ENCOUNTER — Encounter: Payer: Self-pay | Admitting: Medical-Surgical

## 2021-09-13 DIAGNOSIS — F902 Attention-deficit hyperactivity disorder, combined type: Secondary | ICD-10-CM

## 2021-09-13 MED ORDER — AMPHETAMINE-DEXTROAMPHET ER 20 MG PO CP24: 20.0000 mg | ORAL_CAPSULE | ORAL | 0 refills | Status: DC

## 2021-09-13 NOTE — Progress Notes (Signed)
Virtual Visit via Video Note  I connected with Amber Lawrence on 09/13/21 at  2:40 PM EST by a video enabled telemedicine application and verified that I am speaking with the correct person using two identifiers.   I discussed the limitations of evaluation and management by telemedicine and the availability of in person appointments. The patient expressed understanding and agreed to proceed.  Patient location: home Provider locations: office  Subjective:    CC: ADHD follow-up  HPI: Pleasant 46 year old female presenting via [place] video visit for ADHD follow-up.  Unfortunately, her insurance has changed and she is no longer able to afford Vyvanse as it is costing her $190 per month.  She is interested in other options to treat her ADHD.  She took Concerta in the past and does not remember any significant side effects.  Thinks it was a 30 mg dose and tolerated it well.  She also tried Adderall 1 time but does not remember if it was helpful at all.  She does know that she did not have any negative side effects from it.  Past medical history, Surgical history, Family history not pertinant except as noted below, Social history, Allergies, and medications have been entered into the medical record, reviewed, and corrections made.   Review of Systems: See HPI for pertinent positives and negatives.   Objective:    General: Speaking clearly in complete sentences without any shortness of breath.  Alert and oriented x3.  Normal judgment. No apparent acute distress.  Impression and Recommendations:    1. Attention deficit hyperactivity disorder (ADHD), combined type Unsure what her insurance will cover at this point.  Advised her to contact her insurance company and let me know what their preferred medication is.  MyChart message received with the requested information.  Discontinue Vyvanse.  Switching to Adderall XR 20 mg daily.  Plan to follow-up in 4 to 6 weeks to evaluate tolerance and effectiveness  of the medication.  May need dose titration.  I discussed the assessment and treatment plan with the patient. The patient was provided an opportunity to ask questions and all were answered. The patient agreed with the plan and demonstrated an understanding of the instructions.   The patient was advised to call back or seek an in-person evaluation if the symptoms worsen or if the condition fails to improve as anticipated.  25 minutes of non-face-to-face time was provided during this encounter.  Return in about 4 weeks (around 10/11/2021) for ADHD follow up.  Clearnce Sorrel, DNP, APRN, FNP-BC [place] [facility]

## 2021-09-14 ENCOUNTER — Encounter: Payer: Self-pay | Admitting: Medical-Surgical

## 2021-09-14 MED ORDER — AMPHETAMINE-DEXTROAMPHET ER 20 MG PO CP24: 20.0000 mg | ORAL_CAPSULE | ORAL | 0 refills | Status: DC

## 2021-09-23 ENCOUNTER — Ambulatory Visit: Payer: BC Managed Care – PPO | Admitting: Sports Medicine

## 2021-09-23 ENCOUNTER — Other Ambulatory Visit: Payer: Self-pay

## 2021-09-23 DIAGNOSIS — S6991XD Unspecified injury of right wrist, hand and finger(s), subsequent encounter: Secondary | ICD-10-CM | POA: Diagnosis not present

## 2021-09-23 NOTE — Assessment & Plan Note (Signed)
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 She can return to see me as needed.

## 2021-09-23 NOTE — Progress Notes (Signed)
° ° °  Procedures performed today:    None.  Independent interpretation of notes and tests performed by another provider:   None.  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 She can return to see me as needed.    ___________________________________________ Gwen Her. Dianah Field, M.D., ABFM., CAQSM. Primary Care and [place] Instructor of [place] of [facility] of Medicine

## 2021-10-14 ENCOUNTER — Other Ambulatory Visit: Payer: Self-pay | Admitting: Medical-Surgical

## 2021-10-15 MED ORDER — AMPHETAMINE-DEXTROAMPHET ER 20 MG PO CP24: 20.0000 mg | ORAL_CAPSULE | ORAL | 0 refills | Status: DC

## 2021-10-21 ENCOUNTER — Encounter: Payer: Self-pay | Admitting: Medical-Surgical

## 2021-11-01 ENCOUNTER — Encounter: Payer: Self-pay | Admitting: Medical-Surgical

## 2021-11-01 ENCOUNTER — Telehealth: Payer: BC Managed Care – PPO | Admitting: Medical-Surgical

## 2021-11-01 DIAGNOSIS — F902 Attention-deficit hyperactivity disorder, combined type: Secondary | ICD-10-CM | POA: Diagnosis not present

## 2021-11-01 MED ORDER — AMPHETAMINE-DEXTROAMPHET ER 30 MG PO CP24: 30.0000 mg | ORAL_CAPSULE | ORAL | 0 refills | Status: DC

## 2021-11-01 NOTE — Progress Notes (Signed)
Virtual Visit via Video Note ? ?I connected with Amber Lawrence on 11/01/21 at  2:40 PM EDT by a video enabled telemedicine application and verified that I am speaking with the correct person using two identifiers. ?  ?I discussed the limitations of evaluation and management by telemedicine and the availability of in person appointments. The patient expressed understanding and agreed to proceed. ? ?Patient location: home ?Provider locations: office ? ?Subjective:   ? ?CC: Adderall dose change? ? ?HPI: ?Pleasant 46 year old female presenting via [place] video visit to discuss current treatment for ADHD. Was  previously treated with Vyvanse which was very helpful. Unfortunately, her insurance coverage changed and the Vyvanse is no  longer affordable. About 6 weeks ago, we switched her to Adderall XR '20mg'$  daily. She has tolerated this well overall but feels that it isn't as effective as the Vyvanse was. Is interested in increasing her dose.  Denies changes in sleeping pattern, appetite, and weight fluctuations. ? ?Past medical history, Surgical history, Family history not pertinant except as noted below, Social history, Allergies, and medications have been entered into the medical record, reviewed, and corrections made.  ? ?Review of Systems: See HPI for pertinent positives and negatives.  ? ?Objective:   ? ?General: Speaking clearly in complete sentences without any shortness of breath.  Alert and oriented x3.  Normal judgment. No apparent acute distress. ? ?Impression and Recommendations:   ? ?1. Attention deficit hyperactivity disorder (ADHD), combined type ?Increasing Adderall XR to 30 mg daily.  Monitor for potential side effects or intolerances.  Plan to follow-up at her upcoming physical appointment. ? ?I discussed the assessment and treatment plan with the patient. The patient was provided an opportunity to ask questions and all were answered. The patient agreed with the plan and demonstrated an understanding of  the instructions. ?  ?The patient was advised to call back or seek an in-person evaluation if the symptoms worsen or if the condition fails to improve as anticipated. ? ?20 minutes of non-face-to-face time was provided during this encounter. ? ?Return in about 4 weeks (around 11/29/2021) for ADHD follow up. Ok to follow up at her CPE appointment as scheduled.  ? ?Clearnce Sorrel, DNP, APRN, FNP-BC ?[place] ?Primary Care and Sports Medicine ?

## 2021-11-26 ENCOUNTER — Other Ambulatory Visit: Payer: Self-pay | Admitting: Medical-Surgical

## 2021-12-06 ENCOUNTER — Encounter (INDEPENDENT_AMBULATORY_CARE_PROVIDER_SITE_OTHER): Payer: BC Managed Care – PPO | Admitting: Medical-Surgical

## 2021-12-06 DIAGNOSIS — G4700 Insomnia, unspecified: Secondary | ICD-10-CM | POA: Diagnosis not present

## 2021-12-06 MED ORDER — QUETIAPINE FUMARATE 50 MG PO TABS: 50.0000 mg | ORAL_TABLET | Freq: Every day | ORAL | 1 refills | Status: DC

## 2021-12-06 NOTE — Progress Notes (Signed)
Please see the MyChart message reply(ies) for my assessment and plan.    This patient gave consent for this Medical Advice Message and is aware that it may result in a bill to their insurance company, as well as the possibility of receiving a bill for a co-payment or deductible. They are an established patient, but are not seeking medical advice exclusively about a problem treated during an in person or video visit in the last seven days. I did not recommend an in person or video visit within seven days of my reply.    I spent a total of 6 minutes cumulative time within 7 days through MyChart messaging.  Add Dinapoli, NP   

## 2021-12-13 ENCOUNTER — Other Ambulatory Visit: Payer: Self-pay | Admitting: Medical-Surgical

## 2021-12-13 MED ORDER — AMPHETAMINE-DEXTROAMPHET ER 30 MG PO CP24: 30.0000 mg | ORAL_CAPSULE | ORAL | 0 refills | Status: DC

## 2021-12-13 NOTE — Progress Notes (Signed)
HPI: ?[name] is a 46 y.o. female who  has a past medical history of Ectopic pregnancy of right ovary, Lupus ([place]), Other malaise and fatigue, Other symptoms involving skin and integumentary tissues, Palpitations, Raynaud's syndrome, and Skin cancer. ? ?she presents to [facility] today, 12/14/21,  ?for chief complaint of: ?Annual physical exam ? ?Dentist: UTD, every 6 months ?Eye exam: UTD, glasses/contacts ?Exercise: 7 day weekly (spin and Crossfit) ?Diet: lean program, meal prepping  ?Pap smear: UTD, done 09/2020, normal ?Mammogram: due for screening, ordering today ?COVID vaccine: recommended, none ? ?Concerns: ?None ? ?Past medical, surgical, social and family history reviewed: ? ?Patient Active Problem List  ? Diagnosis Date Noted  ? Injury of right middle finger 08/31/2021  ? Attention deficit hyperactivity disorder (ADHD), combined type 05/25/2021  ? Chest pain 10/04/2013  ? Lupus (systemic lupus erythematosus) ([place]) 10/04/2013  ? Raynaud's disease 10/04/2013  ? Palpitation   ? S/P tubal ligation 04/11/2012  ? S/P endometrial ablation 04/11/2012  ? ? ?Past Surgical History:  ?Procedure Laterality Date  ? ABLATION    ? Uterine  ? right salpingectomy    ? TUBAL LIGATION    ? WISDOM TOOTH EXTRACTION    ? ? ?Social History  ? ?Tobacco Use  ? Smoking status: Never  ? Smokeless tobacco: Never  ?Substance Use Topics  ? Alcohol use: Yes  ? ? ?Family History  ?Problem Relation Age of Onset  ? Heart Problems Other   ?     grandmother  ? Heart disease Paternal Grandfather   ? Heart attack Paternal Grandfather   ?  ? ?Current medication list and allergy/intolerance information reviewed:   ? ?Current Outpatient Medications  ?Medication Sig Dispense Refill  ? alfuzosin (UROXATRAL) 10 MG 24 hr tablet Take 10 mg by mouth daily with breakfast.    ? amphetamine-dextroamphetamine (ADDERALL XR) 30 MG 24 hr capsule Take 1 capsule (30 mg total) by mouth every morning. 30 capsule 0  ? LYSINE  ACETATE PO Take by mouth.    ? METAMUCIL FIBER PO Take by mouth.    ? Multiple Vitamin (MULTIVITAMIN) tablet Take 1 tablet by mouth daily.    ? QUEtiapine (SEROQUEL) 50 MG tablet Take 1 tablet (50 mg total) by mouth at bedtime. 30 tablet 1  ? ?No current facility-administered medications for this visit.  ? ? ?No Known Allergies ?  ? ?Review of Systems: ?Constitutional:  No  fever, no chills, No recent illness, No unintentional weight changes. No significant fatigue.  ?HEENT: No  headache, no vision change, no hearing change, No sore throat, No  sinus pressure ?Cardiac: No  chest pain, No  pressure, No palpitations, No  Orthopnea ?Respiratory:  No  shortness of breath. No  Cough ?Gastrointestinal: No  abdominal pain, No  nausea, No  vomiting,  No  blood in stool, No  diarrhea, No  constipation  ?Musculoskeletal: No new myalgia/arthralgia ?Skin: No  Rash, No other wounds/concerning lesions ?Genitourinary: No  incontinence, No  abnormal genital bleeding, No abnormal genital discharge ?Hem/Onc: No  easy bruising/bleeding, No  abnormal lymph node ?Endocrine: No cold intolerance,  No heat intolerance. No polyuria/polydipsia/polyphagia  ?Neurologic: No  weakness, No  dizziness, No  slurred speech/focal weakness/facial droop ?Psychiatric: No  concerns with depression, No  concerns with anxiety, No sleep problems, No mood problems ? ?Exam:  ?BP 134/74   Pulse (!) 57   Resp 20   Ht 5' 9.5" (1.765 m)   Wt 158 lb (  71.7 kg)   SpO2 99%   BMI 23.00 kg/m?  ?Constitutional: VS see above. General Appearance: alert, well-developed, well-nourished, NAD ?Eyes: Normal lids and conjunctive, non-icteric sclera ?Ears, Nose, Mouth, Throat: MMM, Normal external inspection ears/nares/mouth/lips/gums. TM normal bilaterally. Pharynx/tonsils no erythema, no exudate. Nasal mucosa normal.  ?Neck: No masses, trachea midline. No thyroid enlargement. No tenderness/mass appreciated. No lymphadenopathy ?Respiratory: Normal respiratory effort. no  wheeze, no rhonchi, no rales ?Cardiovascular: S1/S2 normal, no murmur, no rub/gallop auscultated. RRR. No lower extremity edema. Pedal pulse II/IV bilaterally DP and PT. No carotid bruit or JVD. No abdominal aortic bruit. ?Gastrointestinal: Nontender, no masses. No hepatomegaly, no splenomegaly. No hernia appreciated. Bowel sounds normal. Rectal exam deferred.  ?Musculoskeletal: Gait normal. No clubbing/cyanosis of digits.  ?Neurological: Normal balance/coordination. No tremor. No cranial nerve deficit on limited exam. Motor and sensation intact and symmetric. Cerebellar reflexes intact.  ?Skin: warm, dry, intact. No rash/ulcer. No concerning nevi or subq nodules on limited exam.   ?Psychiatric: Normal judgment/insight. Normal mood and affect. Oriented x3.  ? ? ?ASSESSMENT/PLAN:  ? ?1. Annual physical exam ?Checking labs today.  Up-to-date on wellness/preventative care. ?- Lipid panel ?- COMPLETE METABOLIC PANEL WITH GFR ?- CBC with Differential/Platelet ? ?2. Attention deficit hyperactivity disorder (ADHD), combined type ?Doing well on Adderall.  Plan to continue at 30 mg daily. ? ?3. Encounter for screening mammogram for malignant neoplasm of breast ?Mammogram ordered. ?- MM 3D SCREEN BREAST BILATERAL; Future ? ?Orders Placed This Encounter  ?Procedures  ? MM 3D SCREEN BREAST BILATERAL  ? Lipid panel  ? COMPLETE METABOLIC PANEL WITH GFR  ? CBC with Differential/Platelet  ? ? ?No orders of the defined types were placed in this encounter. ? ? ?There are no Patient Instructions on file for this visit. ? ?Follow-up plan: Return in about 3 months (around 03/16/2022) for ADHD follow up. ? ?Clearnce Sorrel, DNP, APRN, FNP-BC ?[place] ?Primary Care and Sports Medicine ?

## 2021-12-14 ENCOUNTER — Ambulatory Visit (INDEPENDENT_AMBULATORY_CARE_PROVIDER_SITE_OTHER): Payer: BC Managed Care – PPO | Admitting: Medical-Surgical

## 2021-12-14 ENCOUNTER — Encounter: Payer: Self-pay | Admitting: Medical-Surgical

## 2021-12-14 VITALS — BP 134/74 | HR 57 | Resp 20 | Ht 69.5 in | Wt 158.0 lb

## 2021-12-14 DIAGNOSIS — Z0000 Encounter for general adult medical examination without abnormal findings: Secondary | ICD-10-CM | POA: Diagnosis not present

## 2021-12-14 DIAGNOSIS — Z1231 Encounter for screening mammogram for malignant neoplasm of breast: Secondary | ICD-10-CM

## 2021-12-14 DIAGNOSIS — F902 Attention-deficit hyperactivity disorder, combined type: Secondary | ICD-10-CM

## 2021-12-15 LAB — CBC WITH DIFFERENTIAL/PLATELET
Absolute Monocytes: 301 cells/uL (ref 200–950)
Basophils Absolute: 71 cells/uL (ref 0–200)
Basophils Relative: 1.4 %
Eosinophils Absolute: 133 cells/uL (ref 15–500)
Eosinophils Relative: 2.6 %
HCT: 41.1 % (ref 35.0–45.0)
Hemoglobin: 13.6 g/dL (ref 11.7–15.5)
Lymphs Abs: 1673 cells/uL (ref 850–3900)
MCH: 31.6 pg (ref 27.0–33.0)
MCHC: 33.1 g/dL (ref 32.0–36.0)
MCV: 95.4 fL (ref 80.0–100.0)
MPV: 11.5 fL (ref 7.5–12.5)
Monocytes Relative: 5.9 %
Neutro Abs: 2922 cells/uL (ref 1500–7800)
Neutrophils Relative %: 57.3 %
Platelets: 187 10*3/uL (ref 140–400)
RBC: 4.31 10*6/uL (ref 3.80–5.10)
RDW: 12.1 % (ref 11.0–15.0)
Total Lymphocyte: 32.8 %
WBC: 5.1 10*3/uL (ref 3.8–10.8)

## 2021-12-15 LAB — COMPLETE METABOLIC PANEL WITH GFR
AG Ratio: 1.7 (calc) (ref 1.0–2.5)
ALT: 25 U/L (ref 6–29)
AST: 23 U/L (ref 10–30)
Albumin: 4.5 g/dL (ref 3.6–5.1)
Alkaline phosphatase (APISO): 41 U/L (ref 31–125)
BUN/Creatinine Ratio: 25 (calc) — ABNORMAL HIGH (ref 6–22)
BUN: 26 mg/dL — ABNORMAL HIGH (ref 7–25)
CO2: 30 mmol/L (ref 20–32)
Calcium: 9.5 mg/dL (ref 8.6–10.2)
Chloride: 101 mmol/L (ref 98–110)
Creat: 1.02 mg/dL — ABNORMAL HIGH (ref 0.50–0.99)
Globulin: 2.7 g/dL (calc) (ref 1.9–3.7)
Glucose, Bld: 75 mg/dL (ref 65–139)
Potassium: 4.2 mmol/L (ref 3.5–5.3)
Sodium: 137 mmol/L (ref 135–146)
Total Bilirubin: 0.4 mg/dL (ref 0.2–1.2)
Total Protein: 7.2 g/dL (ref 6.1–8.1)
eGFR: 70 mL/min/{1.73_m2} (ref >=60)

## 2021-12-15 LAB — LIPID PANEL
Cholesterol: 159 mg/dL (ref <200)
HDL: 77 mg/dL (ref >=50)
LDL Cholesterol (Calc): 70 mg/dL (calc)
Non-HDL Cholesterol (Calc): 82 mg/dL (calc) (ref <130)
Total CHOL/HDL Ratio: 2.1 (calc) (ref <5.0)
Triglycerides: 43 mg/dL (ref <150)

## 2021-12-28 DIAGNOSIS — D225 Melanocytic nevi of trunk: Secondary | ICD-10-CM | POA: Diagnosis not present

## 2021-12-28 DIAGNOSIS — L821 Other seborrheic keratosis: Secondary | ICD-10-CM | POA: Diagnosis not present

## 2021-12-28 DIAGNOSIS — L578 Other skin changes due to chronic exposure to nonionizing radiation: Secondary | ICD-10-CM | POA: Diagnosis not present

## 2021-12-28 DIAGNOSIS — L814 Other melanin hyperpigmentation: Secondary | ICD-10-CM | POA: Diagnosis not present

## 2021-12-29 ENCOUNTER — Ambulatory Visit (INDEPENDENT_AMBULATORY_CARE_PROVIDER_SITE_OTHER): Payer: BC Managed Care – PPO

## 2021-12-29 DIAGNOSIS — Z1231 Encounter for screening mammogram for malignant neoplasm of breast: Secondary | ICD-10-CM

## 2022-01-18 ENCOUNTER — Other Ambulatory Visit: Payer: Self-pay | Admitting: Medical-Surgical

## 2022-01-18 MED ORDER — AMPHETAMINE-DEXTROAMPHET ER 30 MG PO CP24: 30.0000 mg | ORAL_CAPSULE | ORAL | 0 refills | Status: DC

## 2022-01-18 NOTE — Telephone Encounter (Signed)
Routing to covering provider.  °

## 2022-02-13 NOTE — Progress Notes (Unsigned)
Virtual Visit via Video Note  I connected with Amber Lawrence on 02/14/22 at 11:10 AM EDT by a video enabled telemedicine application and verified that I am speaking with the correct person using two identifiers.   I discussed the limitations of evaluation and management by telemedicine and the availability of in person appointments. The patient expressed understanding and agreed to proceed.  Patient location: home Provider locations: office  Subjective:    CC: ADHD follow up  HPI: Pleasant 46 year old female presenting today via [place] video visit to follow up on ADHD.  She has been taking Adderall XR 30 mg daily, tolerating well without side effects.  Notes that the medication does not seem to be helping with focus although she does not experience the afternoon slump that she did without the medication.  Feels that the Vyvanse worked so much better but this is not financially feasible due to her poor insurance coverage of the medication.  Is interested in other options including dose adjustment of the Adderall or switching to a different medication.  Also notes that her job has gotten a bit easier lately and she wonders if she may be able to function without the medication.  Has questions regarding Wellbutrin see if that might be something that would be helpful.  No change in eating pattern, appetite, and sleeping pattern.  Past medical history, Surgical history, Family history not pertinant except as noted below, Social history, Allergies, and medications have been entered into the medical record, reviewed, and corrections made.   Review of Systems: See HPI for pertinent positives and negatives.   Objective:    General: Speaking clearly in complete sentences without any shortness of breath.  Alert and oriented x3.  Normal judgment. No apparent acute distress.  Impression and Recommendations:    Attention deficit hyperactivity disorder (ADHD), combined type Discontinue Adderall.  Switching  to Concerta 36 mg daily.  We will try this for 30-day span of time and see how she responds.  If not favorable, we can look at other stimulant options or even look at backing down to a nonstimulant.  Discussed Wellbutrin and its contribution to ADHD management.  She may do well with this as a single agent or even as an adjunct to a nonstimulant.  I discussed the assessment and treatment plan with the patient. The patient was provided an opportunity to ask questions and all were answered. The patient agreed with the plan and demonstrated an understanding of the instructions.   The patient was advised to call back or seek an in-person evaluation if the symptoms worsen or if the condition fails to improve as anticipated.  25 minutes of non-face-to-face time was provided during this encounter.  Return for ADHD follow-up via MyChart patient message in 3 weeks.  Clearnce Sorrel, DNP, APRN, FNP-BC [place] [facility]

## 2022-02-14 ENCOUNTER — Telehealth (INDEPENDENT_AMBULATORY_CARE_PROVIDER_SITE_OTHER): Payer: BC Managed Care – PPO | Admitting: Medical-Surgical

## 2022-02-14 ENCOUNTER — Encounter: Payer: Self-pay | Admitting: Medical-Surgical

## 2022-02-14 DIAGNOSIS — F902 Attention-deficit hyperactivity disorder, combined type: Secondary | ICD-10-CM | POA: Diagnosis not present

## 2022-02-14 MED ORDER — METHYLPHENIDATE HCL ER (OSM) 36 MG PO TBCR: 36.0000 mg | EXTENDED_RELEASE_TABLET | Freq: Every day | ORAL | 0 refills | Status: DC

## 2022-02-14 NOTE — Assessment & Plan Note (Signed)
Discontinue Adderall.  Switching to Concerta 36 mg daily.  We will try this for 30-day span of time and see how she responds.  If not favorable, we can look at other stimulant options or even look at backing down to a nonstimulant.  Discussed Wellbutrin and its contribution to ADHD management.  She may do well with this as a single agent or even as an adjunct to a nonstimulant.

## 2022-02-23 ENCOUNTER — Encounter: Payer: Self-pay | Admitting: Medical-Surgical

## 2022-02-23 DIAGNOSIS — Z113 Encounter for screening for infections with a predominantly sexual mode of transmission: Secondary | ICD-10-CM

## 2022-02-23 NOTE — Telephone Encounter (Signed)

## 2022-02-24 DIAGNOSIS — Z113 Encounter for screening for infections with a predominantly sexual mode of transmission: Secondary | ICD-10-CM | POA: Diagnosis not present

## 2022-02-24 MED ORDER — METRONIDAZOLE 500 MG PO TABS: 500.0000 mg | ORAL_TABLET | Freq: Two times a day (BID) | ORAL | 0 refills | Status: AC

## 2022-02-24 NOTE — Addendum Note (Signed)
Addended bySamuel Bouche on: 02/24/2022 06:12 PM   Modules accepted: Orders

## 2022-02-25 LAB — WET PREP FOR TRICH, YEAST, CLUE
MICRO NUMBER:: 13702697
Specimen Quality: ADEQUATE

## 2022-02-25 LAB — HEPATITIS C ANTIBODY: Hepatitis C Ab: NONREACTIVE

## 2022-02-25 LAB — HIV ANTIBODY (ROUTINE TESTING W REFLEX): HIV 1&2 Ab, 4th Generation: NONREACTIVE

## 2022-02-25 LAB — C. TRACHOMATIS/N. GONORRHOEAE RNA
C. trachomatis RNA, TMA: NOT DETECTED
N. gonorrhoeae RNA, TMA: NOT DETECTED

## 2022-02-25 LAB — HEPATITIS B SURFACE ANTIGEN: Hepatitis B Surface Ag: NONREACTIVE

## 2022-02-25 LAB — RPR: RPR Ser Ql: NONREACTIVE

## 2022-03-07 MED ORDER — METHYLPHENIDATE HCL ER (OSM) 36 MG PO TBCR: 36.0000 mg | EXTENDED_RELEASE_TABLET | Freq: Every day | ORAL | 0 refills | Status: DC

## 2022-03-07 MED ORDER — METHYLPHENIDATE HCL ER (OSM) 36 MG PO TBCR
36.0000 mg | EXTENDED_RELEASE_TABLET | Freq: Every day | ORAL | 0 refills | Status: DC
Start: 2022-03-16 — End: 2022-06-17

## 2022-05-31 ENCOUNTER — Ambulatory Visit (INDEPENDENT_AMBULATORY_CARE_PROVIDER_SITE_OTHER): Payer: BC Managed Care – PPO

## 2022-05-31 ENCOUNTER — Other Ambulatory Visit: Payer: Self-pay | Admitting: Medical-Surgical

## 2022-05-31 ENCOUNTER — Encounter: Payer: Self-pay | Admitting: Sports Medicine

## 2022-05-31 ENCOUNTER — Ambulatory Visit: Payer: BC Managed Care – PPO | Admitting: Sports Medicine

## 2022-05-31 DIAGNOSIS — F902 Attention-deficit hyperactivity disorder, combined type: Secondary | ICD-10-CM

## 2022-05-31 DIAGNOSIS — M25552 Pain in left hip: Secondary | ICD-10-CM

## 2022-05-31 DIAGNOSIS — S73199A Other sprain of unspecified hip, initial encounter: Secondary | ICD-10-CM | POA: Insufficient documentation

## 2022-05-31 DIAGNOSIS — M25551 Pain in right hip: Secondary | ICD-10-CM

## 2022-05-31 MED ORDER — MELOXICAM 15 MG PO TABS: ORAL_TABLET | ORAL | 3 refills | Status: DC

## 2022-05-31 NOTE — Telephone Encounter (Signed)
Please contact patient to schedule for ADHD f/u . Prescription denied until seen. Thank you! :)

## 2022-05-31 NOTE — Telephone Encounter (Signed)
Prescription refill request of methylphenidate '36mg'$  CR  Last fill 04/15/2022 Last visit 02/14/22 ( virutal visit)  Next appt 05/31/22 with Dr. Darene Lamer No upcoming appt schld with Samuel Bouche, NP

## 2022-05-31 NOTE — Progress Notes (Signed)
    Procedures performed today:    None.  Independent interpretation of notes and tests performed by another provider:   None.  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 She has no tenderness over the greater trochanter, the rest of the hip exam is unremarkable. Excellent strength to abduction, flexion, extension. I do think she likely has a myositis, or possibly mild rhabdomyolysis, a iliac type stress injury is also in the differential here, adding meloxicam, x-rays, and a left hip/pelvic MRI. She has failed greater than 6 weeks conservative treatment and she has done formal physical therapy. Return to see me over MRI results.    ____________________________________________ Gwen Her. Dianah Field, M.D., ABFM., CAQSM., AME. Primary Care and Sports Medicine [place] [facility]  Adjunct Professor of [place] of [facility] of Medicine  Risk manager

## 2022-05-31 NOTE — Assessment & Plan Note (Signed)
This is a very pleasant 46 year old female, she is a Product manager and a Media planner, for the past 3 months she has had pain bilateral hips, localized posterolaterally at the iliac crest and hip abductor origin. She has no tenderness over the greater trochanter, the rest of the hip exam is unremarkable. Excellent strength to abduction, flexion, extension. I do think she likely has a myositis, or possibly mild rhabdomyolysis, a iliac type stress injury is also in the differential here, adding meloxicam, x-rays, and a left hip/pelvic MRI. She has failed greater than 6 weeks conservative treatment and she has done formal physical therapy. Return to see me over MRI results.

## 2022-06-01 ENCOUNTER — Telehealth: Payer: Self-pay | Admitting: Medical-Surgical

## 2022-06-01 DIAGNOSIS — F902 Attention-deficit hyperactivity disorder, combined type: Secondary | ICD-10-CM

## 2022-06-01 LAB — CBC
HCT: 39.4 % (ref 35.0–45.0)
Hemoglobin: 13.5 g/dL (ref 11.7–15.5)
MCH: 31.4 pg (ref 27.0–33.0)
MCHC: 34.3 g/dL (ref 32.0–36.0)
MCV: 91.6 fL (ref 80.0–100.0)
MPV: 10.5 fL (ref 7.5–12.5)
Platelets: 256 10*3/uL (ref 140–400)
RBC: 4.3 10*6/uL (ref 3.80–5.10)
RDW: 11.4 % (ref 11.0–15.0)
WBC: 7.5 10*3/uL (ref 3.8–10.8)

## 2022-06-01 LAB — COMPLETE METABOLIC PANEL WITH GFR
AG Ratio: 1.8 (calc) (ref 1.0–2.5)
ALT: 20 U/L (ref 6–29)
AST: 30 U/L (ref 10–35)
Albumin: 4 g/dL (ref 3.6–5.1)
Alkaline phosphatase (APISO): 48 U/L (ref 31–125)
BUN/Creatinine Ratio: 15 (calc) (ref 6–22)
BUN: 17 mg/dL (ref 7–25)
CO2: 30 mmol/L (ref 20–32)
Calcium: 8.9 mg/dL (ref 8.6–10.2)
Chloride: 102 mmol/L (ref 98–110)
Creat: 1.16 mg/dL — ABNORMAL HIGH (ref 0.50–0.99)
Globulin: 2.2 g/dL (calc) (ref 1.9–3.7)
Glucose, Bld: 84 mg/dL (ref 65–99)
Potassium: 4.5 mmol/L (ref 3.5–5.3)
Sodium: 139 mmol/L (ref 135–146)
Total Bilirubin: 0.5 mg/dL (ref 0.2–1.2)
Total Protein: 6.2 g/dL (ref 6.1–8.1)
eGFR: 59 mL/min/{1.73_m2} — ABNORMAL LOW (ref >=60)

## 2022-06-01 LAB — CK: Total CK: 266 U/L — ABNORMAL HIGH (ref 29–143)

## 2022-06-01 MED ORDER — METHYLPHENIDATE HCL ER (OSM) 36 MG PO TBCR: 36.0000 mg | EXTENDED_RELEASE_TABLET | Freq: Every day | ORAL | 0 refills | Status: DC

## 2022-06-01 NOTE — Telephone Encounter (Signed)
Patient advised.

## 2022-06-01 NOTE — Telephone Encounter (Signed)
Refill sent to get her through to her appointment. ___________________________________________ Clearnce Sorrel, DNP, APRN, FNP-BC Primary Care and [place]

## 2022-06-01 NOTE — Telephone Encounter (Signed)
Patient has upcoming appt schld for 06/17/22.

## 2022-06-01 NOTE — Telephone Encounter (Signed)
Lvm for the patient to call back to schedule an appointment for a ADHD follow up. -tvt

## 2022-06-01 NOTE — Telephone Encounter (Signed)
Patient called to schedule and is scheduled for 11/17 at 3:00 PM, as appointment was soonest available in-person. Patient has three pills left for ADHD prescription and requested temporary refill on medication until appointment time. Buel Ream

## 2022-06-04 ENCOUNTER — Ambulatory Visit (INDEPENDENT_AMBULATORY_CARE_PROVIDER_SITE_OTHER): Payer: BC Managed Care – PPO

## 2022-06-04 DIAGNOSIS — S73192A Other sprain of left hip, initial encounter: Secondary | ICD-10-CM | POA: Diagnosis not present

## 2022-06-04 DIAGNOSIS — M25552 Pain in left hip: Secondary | ICD-10-CM

## 2022-06-14 ENCOUNTER — Ambulatory Visit (INDEPENDENT_AMBULATORY_CARE_PROVIDER_SITE_OTHER): Payer: BC Managed Care – PPO | Admitting: Sports Medicine

## 2022-06-14 DIAGNOSIS — M25551 Pain in right hip: Secondary | ICD-10-CM | POA: Diagnosis not present

## 2022-06-14 DIAGNOSIS — M25552 Pain in left hip: Secondary | ICD-10-CM | POA: Diagnosis not present

## 2022-06-14 NOTE — Assessment & Plan Note (Signed)
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 She has similar pain on the right side. My suspicion is bilateral hip labral tears, we will try bilateral hip joint injections prior to referring her to one of our hip arthroscopist. She will make an appointment for this.

## 2022-06-14 NOTE — Progress Notes (Signed)
    Procedures performed today:    None.  Independent interpretation of notes and tests performed by another provider:   None.  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 She has similar pain on the right side. My suspicion is bilateral hip labral tears, we will try bilateral hip joint injections prior to referring her to one of our hip arthroscopist. She will make an appointment for this.    ____________________________________________ Gwen Her. Dianah Field, M.D., ABFM., CAQSM., AME. Primary Care and Sports Medicine [place] [facility]  Adjunct Professor of [place] of [facility] of Medicine  Risk manager

## 2022-06-17 ENCOUNTER — Encounter: Payer: Self-pay | Admitting: Medical-Surgical

## 2022-06-17 ENCOUNTER — Ambulatory Visit: Payer: BC Managed Care – PPO | Admitting: Medical-Surgical

## 2022-06-17 VITALS — BP 124/65 | HR 66 | Resp 20 | Ht 69.5 in | Wt 172.8 lb

## 2022-06-17 DIAGNOSIS — F902 Attention-deficit hyperactivity disorder, combined type: Secondary | ICD-10-CM | POA: Diagnosis not present

## 2022-06-17 MED ORDER — METHYLPHENIDATE HCL ER (OSM) 54 MG PO TBCR: 54.0000 mg | EXTENDED_RELEASE_TABLET | ORAL | 0 refills | Status: DC

## 2022-06-17 NOTE — Progress Notes (Signed)
Medical screening examination/treatment was performed by qualified nurse practitioner student and as supervising provider I was immediately available for consultation/collaboration. I have reviewed documentation and agree with assessment and plan. ° °Shakerra Red L. Arthi Mcdonald, DNP, APRN, FNP-BC °[facility] [place] °Primary Care and Sports Medicine ° °

## 2022-06-17 NOTE — Progress Notes (Signed)
   Established Patient Office Visit  Subjective   Patient ID: Amber Lawrence, female    DOB: [birth year]  Age: 46 y.o. MRN: [id number]  Chief Complaint  Patient presents with   ADHD   Follow-up    HPI  Amber Lawrence is a very pleasant 46 yo female here for ADHD medication follow up. She is currently on methylphenidate (Concerta) 36 mg once daily. She reports that this works for her but feels as though there is room for improvement in regards to concentration. She denies any jitteriness, palpitations, constipation more than usual that can't be relieved by OTC medications. Denies any insomnia or sleep disturbance. No changes in weight or appetite. In the past she's been on Vyvanse at 50 mg that worked well and she did not tolerate Adderrall well. She did not voice any other concern at this visit.   Review of Systems  Constitutional: Negative.   Respiratory: Negative.    Cardiovascular: Negative.   Gastrointestinal: Negative.   Genitourinary: Negative.   Neurological: Negative.   Psychiatric/Behavioral: Negative.        Objective:     BP 124/65   Pulse 66   Resp 20   Ht 5' 9.5" (1.765 m)   Wt 78.4 kg   LMP 05/17/2022   SpO2 99%   BMI 25.15 kg/m    Physical Exam Constitutional:      General: She is not in acute distress.    Appearance: Normal appearance. She is normal weight. She is not ill-appearing, toxic-appearing or diaphoretic.  HENT:     Head: Normocephalic and atraumatic.  Eyes:     General: No scleral icterus. Cardiovascular:     Rate and Rhythm: Normal rate and regular rhythm.     Pulses: Normal pulses.     Heart sounds: Normal heart sounds.  Pulmonary:     Effort: Pulmonary effort is normal.     Breath sounds: Normal breath sounds.  Skin:    General: Skin is warm and dry.     Capillary Refill: Capillary refill takes less than 2 seconds.  Neurological:     General: No focal deficit present.     Mental Status: She is alert and oriented to person, place, and time.  Mental status is at baseline.  Psychiatric:        Mood and Affect: Mood normal.        Behavior: Behavior normal.        Thought Content: Thought content normal.        Judgment: Judgment normal.      No results found for any visits on 06/17/22.    The 10-year ASCVD risk score (Arnett DK, et al., 2019) is: 0.3%    Assessment & Plan:   1. Attention deficit hyperactivity disorder (ADHD), combined type We discussed options in regards to her medication, including switching to generic Vyvanse. At this point she opted to increase the dose of methylphenidate (Concerta) to 54 mg daily as she tolerates this medication very well without any concerning side effects. We will try this for a month and she will let me know how it goes via MyChart.   -methylphenidate (Concerta) 54 mg PO CR tablet, once daily.  Return in about 3 months (around 09/17/2022) for ADHD medication follow up.    Ralph Leyden, RN Student NP

## 2022-06-27 ENCOUNTER — Ambulatory Visit: Payer: BC Managed Care – PPO | Admitting: Sports Medicine

## 2022-06-30 ENCOUNTER — Ambulatory Visit (INDEPENDENT_AMBULATORY_CARE_PROVIDER_SITE_OTHER): Payer: BC Managed Care – PPO

## 2022-06-30 ENCOUNTER — Ambulatory Visit: Payer: BC Managed Care – PPO | Admitting: Sports Medicine

## 2022-06-30 DIAGNOSIS — M25552 Pain in left hip: Secondary | ICD-10-CM | POA: Diagnosis not present

## 2022-06-30 DIAGNOSIS — M25551 Pain in right hip: Secondary | ICD-10-CM

## 2022-06-30 DIAGNOSIS — S73199S Other sprain of unspecified hip, sequela: Secondary | ICD-10-CM

## 2022-06-30 MED ORDER — TRIAMCINOLONE ACETONIDE 40 MG/ML IJ SUSP
80.0000 mg | Freq: Once | INTRAMUSCULAR | Status: AC
  Administered 2022-06-30: 80 mg via INTRAMUSCULAR

## 2022-06-30 NOTE — Progress Notes (Signed)
    Procedures performed today:    Procedure: Real-time Ultrasound Guided injection of the left hip joint Device: Samsung HS60  Verbal informed consent obtained.  Time-out conducted.  Noted no overlying erythema, induration, or other signs of local infection.  Skin prepped in a sterile fashion.  Local anesthesia: Topical Ethyl chloride.  With sterile technique and under real time ultrasound guidance: No effusion noted, 1 cc Kenalog 40, 2 cc lidocaine, 2 cc bupivacaine injected easily Completed without difficulty  Advised to call if fevers/chills, erythema, induration, drainage, or persistent bleeding.  Images permanently stored and available for review in PACS.  Impression: Technically successful ultrasound guided injection.  Procedure: Real-time Ultrasound Guided injection of the right hip joint Device: Samsung HS60  Verbal informed consent obtained.  Time-out conducted.  Noted no overlying erythema, induration, or other signs of local infection.  Skin prepped in a sterile fashion.  Local anesthesia: Topical Ethyl chloride.  With sterile technique and under real time ultrasound guidance: No effusion noted, 1 cc Kenalog 40, 2 cc lidocaine, 2 cc bupivacaine injected easily Completed without difficulty  Advised to call if fevers/chills, erythema, induration, drainage, or persistent bleeding.  Images permanently stored and available for review in PACS.  Impression: Technically successful ultrasound guided injection.  Independent interpretation of notes and tests performed by another provider:   None.  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 Today we did bilateral hip joint injections, and if this fails we will refer her to one of her hip arthroscopist. Return to see me in 4 weeks.    ____________________________________________ Gwen Her. Dianah Field, M.D., ABFM., CAQSM., AME. Primary Care and Sports Medicine [facility]  Adjunct Professor of [employer] of [employer]  Risk manager

## 2022-06-30 NOTE — Assessment & Plan Note (Signed)
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 Today we did bilateral hip joint injections, and if this fails we will refer her to one of her hip arthroscopist. Return to see me in 4 weeks.

## 2022-07-22 ENCOUNTER — Other Ambulatory Visit: Payer: Self-pay | Admitting: Medical-Surgical

## 2022-07-26 ENCOUNTER — Ambulatory Visit: Payer: BC Managed Care – PPO | Admitting: Sports Medicine

## 2022-07-26 DIAGNOSIS — S73199A Other sprain of unspecified hip, initial encounter: Secondary | ICD-10-CM | POA: Diagnosis not present

## 2022-07-26 DIAGNOSIS — S73199S Other sprain of unspecified hip, sequela: Secondary | ICD-10-CM | POA: Diagnosis not present

## 2022-07-26 MED ORDER — METHYLPHENIDATE HCL ER (OSM) 54 MG PO TBCR: 54.0000 mg | EXTENDED_RELEASE_TABLET | ORAL | 0 refills | Status: DC

## 2022-07-26 NOTE — Progress Notes (Signed)
    Procedures performed today:    None.  Independent interpretation of notes and tests performed by another provider:   None.  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 At the last visit in November of this year I did bilateral hip joint injections with ultrasound guidance, unfortunately she continues to have discomfort, she will do meloxicam, avoid deep hip flexion, and I would like a consult from one of our hip arthroscopists. Return to see me as needed.    ____________________________________________ Gwen Her. Dianah Field, M.D., ABFM., CAQSM., AME. Primary Care and Sports Medicine [facility]  Adjunct Professor of [employer] [employer] [facility] of Medicine  Risk manager

## 2022-07-26 NOTE — Telephone Encounter (Signed)
Please contact patient.  She is welcome to send a MyChart message or speak with you over the phone.  Need to make sure that she is doing well on this dose and that is working well for her.  I did send in a 30-day supply so that she can get it [place] but it would be good to have this updated in the chart for tolerance and response.

## 2022-07-26 NOTE — Assessment & Plan Note (Signed)
Very pleasant 46 year old female, she does CrossFit, she is a Product manager, she has not had about 6 months of bilateral hip pain localized posterior/laterally as well as at the hip abductor origin, some of the groin, she had some positive labral signs on exam so obtain an MRI that confirmed anterior hip labral tear on the left, she has similar pain on the right. At the last visit in November of this year I did bilateral hip joint injections with ultrasound guidance, unfortunately she continues to have discomfort, she will do meloxicam, avoid deep hip flexion, and I would like a consult from one of our hip arthroscopists. Return to see me as needed.

## 2022-07-27 NOTE — Telephone Encounter (Signed)
Patient scheduled.

## 2022-08-10 ENCOUNTER — Ambulatory Visit (HOSPITAL_BASED_OUTPATIENT_CLINIC_OR_DEPARTMENT_OTHER): Payer: BC Managed Care – PPO | Admitting: Orthopaedic Surgery

## 2022-08-18 ENCOUNTER — Ambulatory Visit (HOSPITAL_BASED_OUTPATIENT_CLINIC_OR_DEPARTMENT_OTHER): Payer: BC Managed Care – PPO | Admitting: Orthopaedic Surgery

## 2022-08-24 ENCOUNTER — Encounter: Payer: Self-pay | Admitting: Medical-Surgical

## 2022-08-24 ENCOUNTER — Ambulatory Visit (INDEPENDENT_AMBULATORY_CARE_PROVIDER_SITE_OTHER): Payer: BC Managed Care – PPO | Admitting: Medical-Surgical

## 2022-08-24 VITALS — BP 102/61 | HR 61 | Resp 20 | Ht 69.5 in | Wt 167.1 lb

## 2022-08-24 DIAGNOSIS — Z1211 Encounter for screening for malignant neoplasm of colon: Secondary | ICD-10-CM

## 2022-08-24 DIAGNOSIS — G4700 Insomnia, unspecified: Secondary | ICD-10-CM

## 2022-08-24 DIAGNOSIS — F902 Attention-deficit hyperactivity disorder, combined type: Secondary | ICD-10-CM | POA: Diagnosis not present

## 2022-08-24 MED ORDER — METHYLPHENIDATE HCL ER (OSM) 54 MG PO TBCR: 54.0000 mg | EXTENDED_RELEASE_TABLET | ORAL | 0 refills | Status: DC

## 2022-08-24 MED ORDER — QUETIAPINE FUMARATE 25 MG PO TABS: 25.0000 mg | ORAL_TABLET | Freq: Every day | ORAL | 1 refills | Status: DC

## 2022-08-24 NOTE — Progress Notes (Signed)
Established Patient Office Visit  Subjective   Patient ID: Amber Lawrence, female   DOB: November 05, 1975 Age: 47 y.o. MRN: [id number]   Chief Complaint  Patient presents with   ADHD   Follow-up   HPI Pleasant 47 year old female presenting today for the following:  ADHD: Taking Concerta 54 mg daily, tolerating well without side effects.  Does note that her appetite is decreased however feels that she has a good handle on managing this.  Sleeping without difficulty and has had no significant weight fluctuations.  Feels the medication works well for her and would like to stay on this medication.  Insomnia: Has been using Seroquel 50 mg nightly as needed but reports that she can only take this when she does not have to work the next day because it seems to be too effective and keeps her groggy.  She is interested in cutting this back to 25 mg nightly as needed since this was helpful before.   Objective:    Vitals:   08/24/22 0958  BP: 102/61  Pulse: 61  Resp: 20  Height: 5' 9.5" (1.765 m)  Weight: 167 lb 1.3 oz (75.8 kg)  SpO2: 97%  BMI (Calculated): 24.33    Physical Exam Vitals and nursing note reviewed.  Constitutional:      General: She is not in acute distress.    Appearance: Normal appearance. She is normal weight. She is not ill-appearing.  HENT:     Head: Normocephalic and atraumatic.  Cardiovascular:     Rate and Rhythm: Normal rate and regular rhythm.     Pulses: Normal pulses.     Heart sounds: Normal heart sounds.  Pulmonary:     Effort: Pulmonary effort is normal. No respiratory distress.     Breath sounds: Normal breath sounds. No wheezing, rhonchi or rales.  Skin:    General: Skin is warm and dry.  Neurological:     Mental Status: She is alert and oriented to person, place, and time.     Gait: Gait normal.  Psychiatric:        Mood and Affect: Mood normal.        Behavior: Behavior normal.        Thought Content: Thought content normal.        Judgment:  Judgment normal.   No results found for this or any previous visit (from the past 24 hour(s)).     The 10-year ASCVD risk score (Arnett DK, et al., 2019) is: 0.2%   Values used to calculate the score:     Age: 64 years     Sex: Female     Is Non-Hispanic African American: No     Diabetic: No     Tobacco smoker: No     Systolic Blood Pressure: 194 mmHg     Is BP treated: No     HDL Cholesterol: 77 mg/dL     Total Cholesterol: 159 mg/dL   Assessment & Plan:   1. Attention deficit hyperactivity disorder (ADHD), combined type Symptoms well-controlled.  Continue Concerta 54 mg daily. - methylphenidate (CONCERTA) 54 MG PO CR tablet; Take 1 tablet (54 mg total) by mouth every morning.  Dispense: 30 tablet; Refill: 0 - methylphenidate (CONCERTA) 54 MG PO CR tablet; Take 1 tablet (54 mg total) by mouth every morning.  Dispense: 30 tablet; Refill: 0 - methylphenidate (CONCERTA) 54 MG PO CR tablet; Take 1 tablet (54 mg total) by mouth every morning.  Dispense: 30 tablet; Refill: 0  2.  Mixed insomnia Reducing Seroquel to 25 mg nightly as needed.  3. Colon cancer screening Referring to GI for colonoscopy. - Ambulatory referral to Gastroenterology  Return in about 3 months (around 11/23/2022) for ADHD follow up.  ___________________________________________ Clearnce Sorrel, DNP, APRN, FNP-BC Primary Care and [place]

## 2022-09-06 DIAGNOSIS — F4320 Adjustment disorder, unspecified: Secondary | ICD-10-CM | POA: Diagnosis not present

## 2022-09-13 IMAGING — MG MM DIGITAL SCREENING BILAT W/ TOMO AND CAD
6 of 10 series · 6 of 30 positions shown · non-contrast
Comparison: Previous exam(s).

CLINICAL DATA: Screening.

EXAM:
DIGITAL SCREENING BILATERAL MAMMOGRAM WITH TOMOSYNTHESIS AND CAD
TECHNIQUE: Bilateral screening digital craniocaudal and mediolateral oblique
mammograms were obtained. Bilateral screening digital breast
tomosynthesis was performed. The images were evaluated with
computer-aided detection.

[L CC synth-2D]
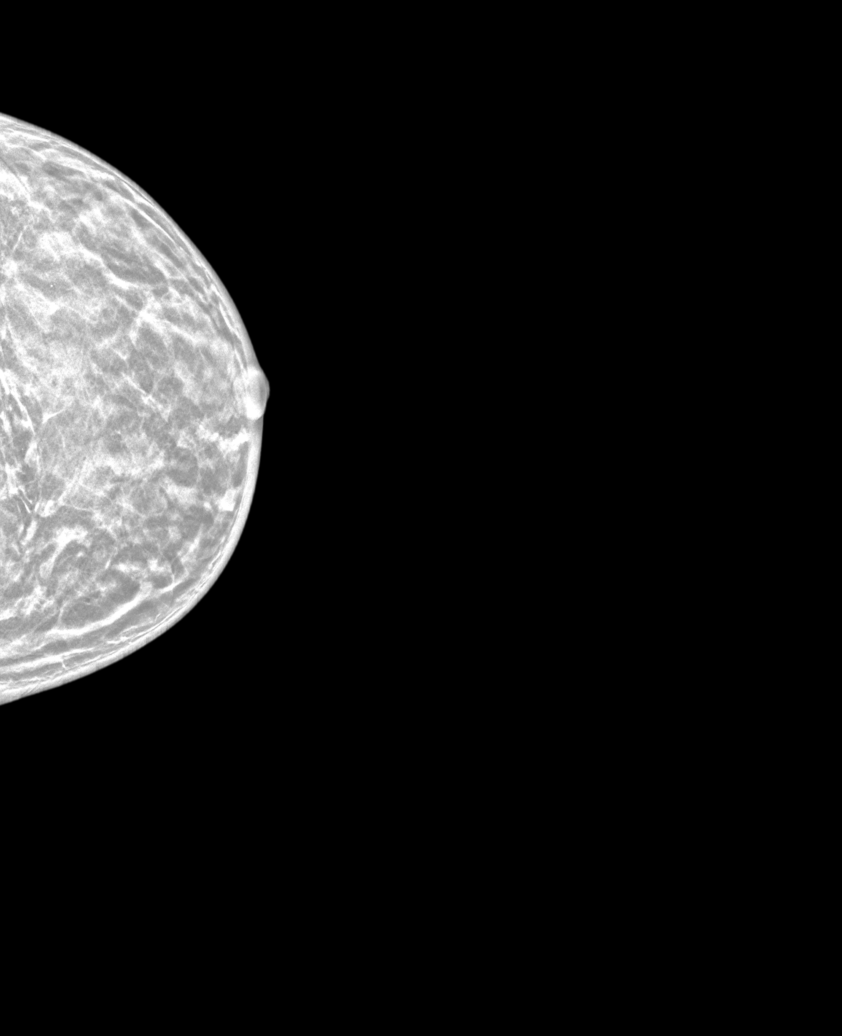

[L MLO synth-2D (1 of 2)]
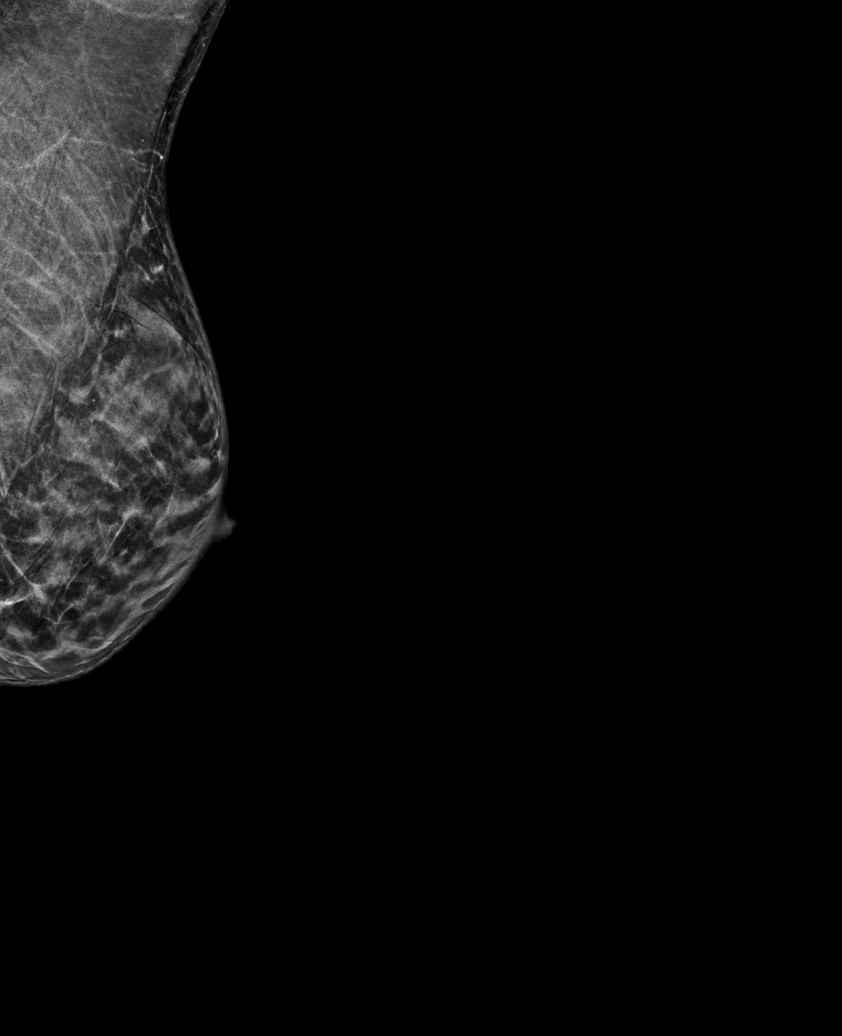

[R CC synth-2D]
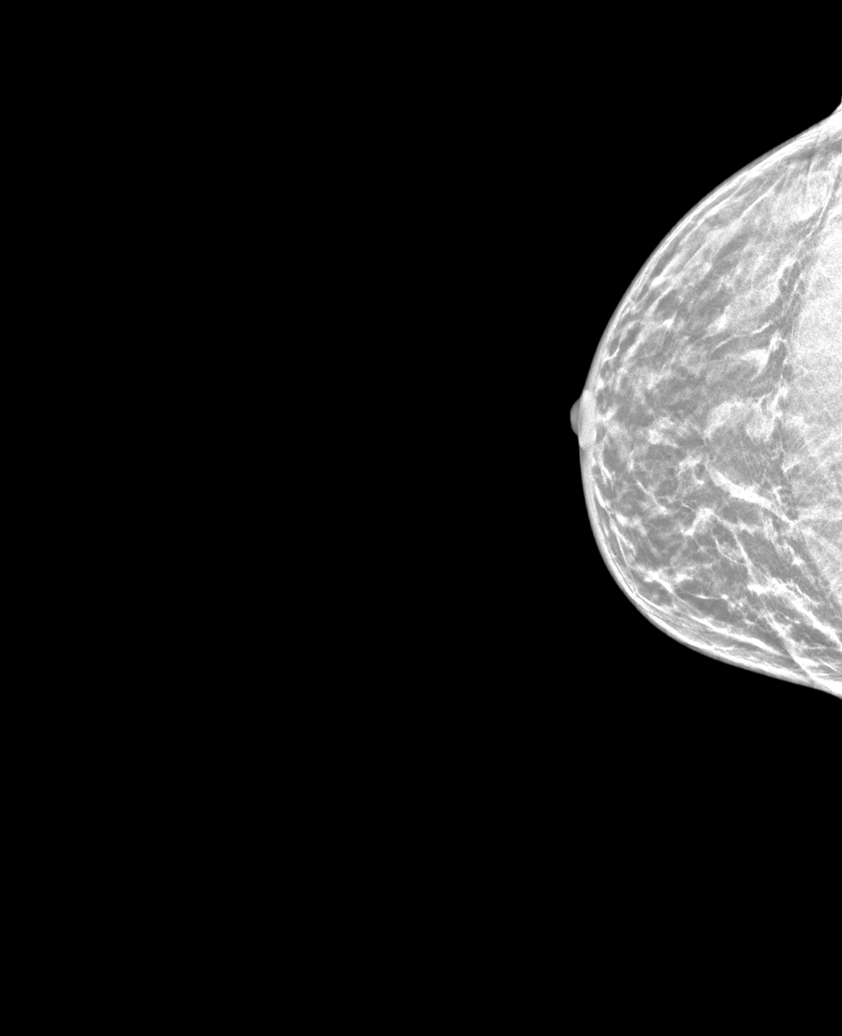

[L MLO synth-2D (2 of 2)]
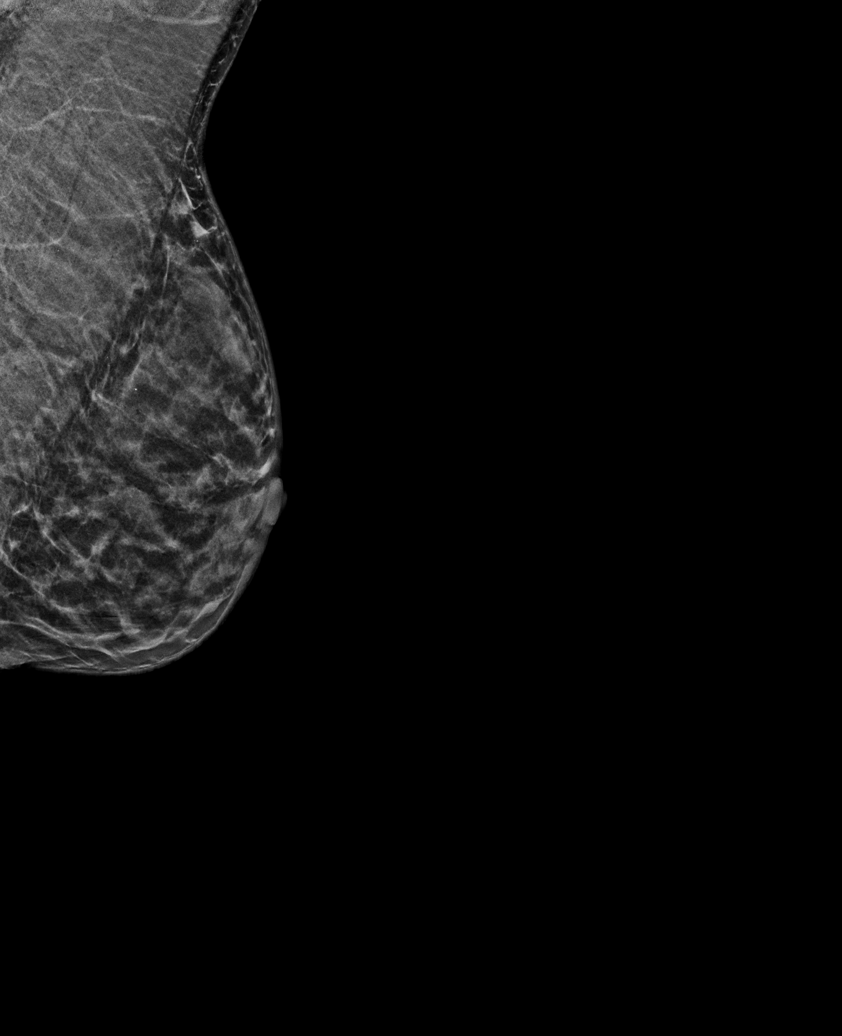

[R MLO synth-2D]
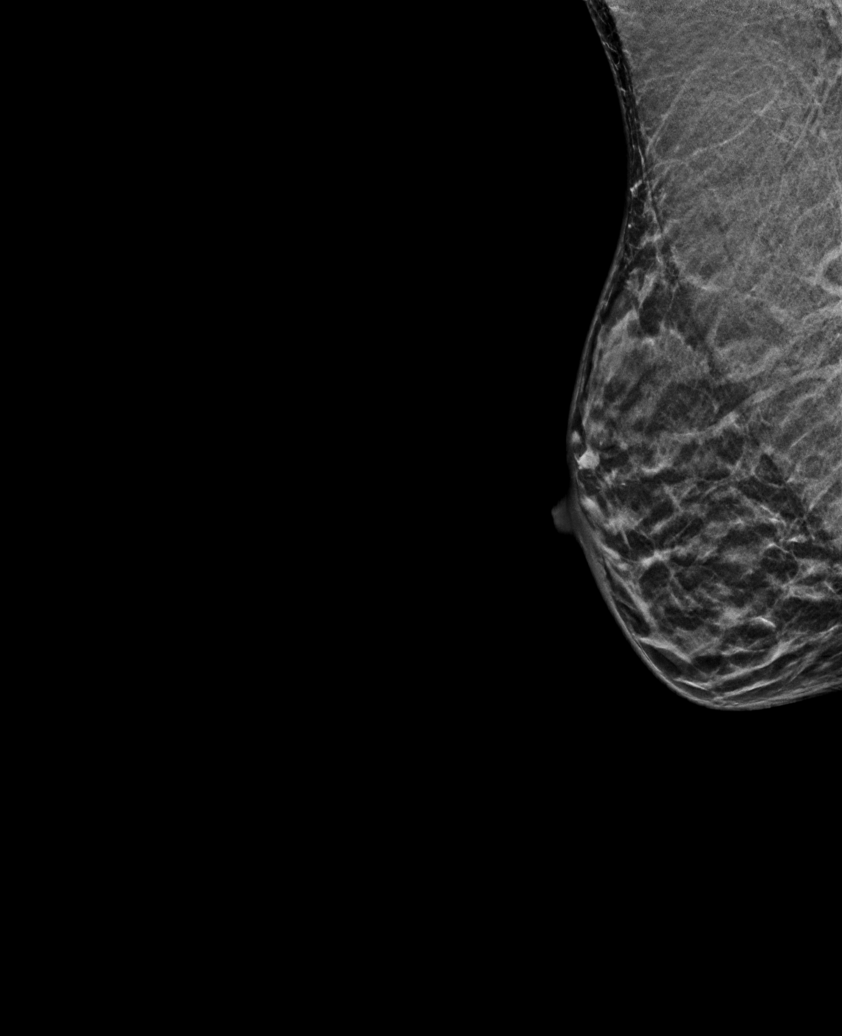

[R CC tomo · tomo slice 19/36.0]
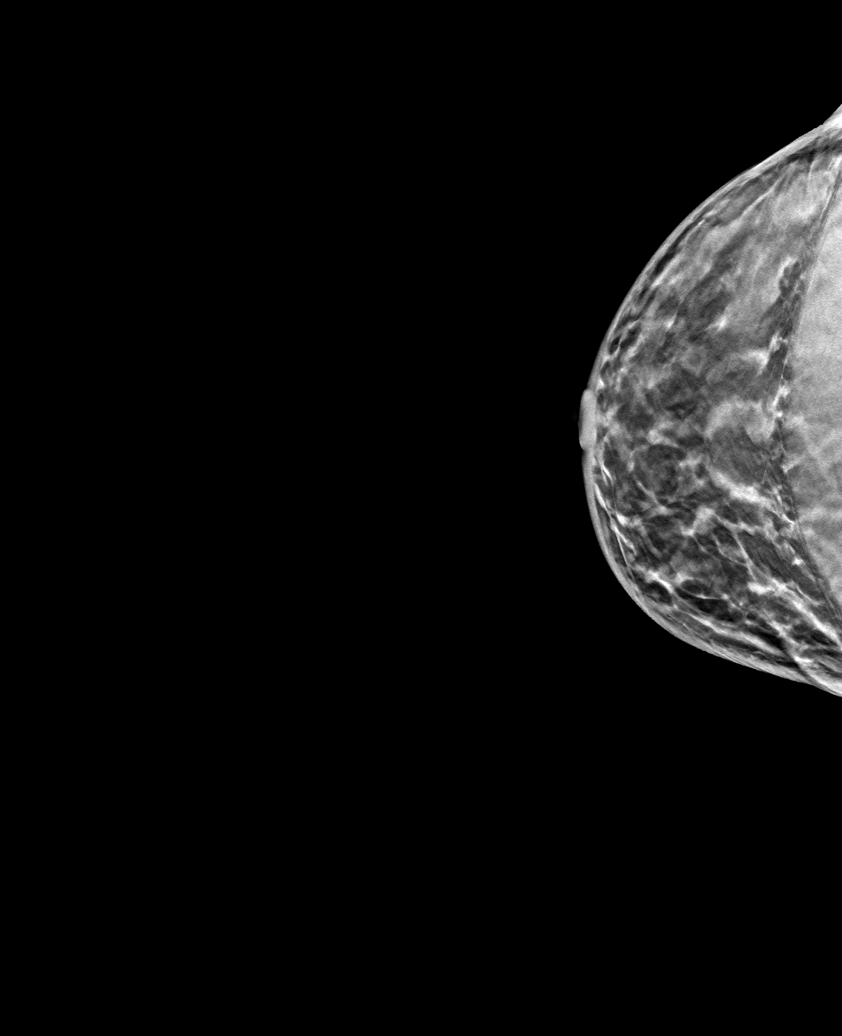

[6 of 30 positions shown; findings below may reference images not displayed]

ACR Breast Density Category c: The breast tissue is heterogeneously
dense, which may obscure small masses.
FINDINGS: There are no findings suspicious for malignancy.
IMPRESSION: No mammographic evidence of malignancy. A result letter of this
screening mammogram will be mailed directly to the patient.

RECOMMENDATION:
Screening mammogram in one year. (Code:[id number])

BI-RADS CATEGORY  1: Negative.

## 2022-09-19 ENCOUNTER — Other Ambulatory Visit: Payer: Self-pay | Admitting: Sports Medicine

## 2022-09-19 MED ORDER — TRAMADOL HCL 50 MG PO TABS: 50.0000 mg | ORAL_TABLET | Freq: Three times a day (TID) | ORAL | 0 refills | Status: DC | PRN

## 2022-09-22 ENCOUNTER — Ambulatory Visit (INDEPENDENT_AMBULATORY_CARE_PROVIDER_SITE_OTHER): Payer: BC Managed Care – PPO | Admitting: Orthopaedic Surgery

## 2022-09-22 DIAGNOSIS — M25551 Pain in right hip: Secondary | ICD-10-CM

## 2022-09-22 DIAGNOSIS — M25552 Pain in left hip: Secondary | ICD-10-CM

## 2022-09-22 DIAGNOSIS — S76012A Strain of muscle, fascia and tendon of left hip, initial encounter: Secondary | ICD-10-CM | POA: Diagnosis not present

## 2022-09-22 DIAGNOSIS — S76011A Strain of muscle, fascia and tendon of right hip, initial encounter: Secondary | ICD-10-CM

## 2022-09-22 MED ORDER — TRIAMCINOLONE ACETONIDE 40 MG/ML IJ SUSP
80.0000 mg | INTRAMUSCULAR | Status: AC | PRN
  Administered 2022-09-22: 80 mg via INTRA_ARTICULAR

## 2022-09-22 MED ORDER — LIDOCAINE HCL 1 % IJ SOLN
4.0000 mL | INTRAMUSCULAR | Status: AC | PRN
  Administered 2022-09-22: 4 mL

## 2022-09-22 NOTE — Progress Notes (Signed)
Chief Complaint: Left worse than right hip pain      History of Present Illness:    Amber Lawrence is a 47 y.o. female presents today with ongoing left worse than right hip pain.  She states that she is experiencing the majority of her pain near the origin of the gluteus muscles and this does radiate to the lateral hip.  She is having an extremely difficult time laying on the left side.  She has been doing physical therapy previously for 3 months although this somewhat stagnated without significant relief.  She did previously have injections into both femoral acetabular joints with Dr. Darene Lamer which gave her approximately 1 day of relief.  She experiences the pain as a dull ache.  She is extremely active and enjoys CrossFit although this has been quite limited as result of her lateral hip pain.  She also teaches spin classes well    Surgical History:   None  PMH/PSH/Family History/Social History/Meds/Allergies:    Past Medical History:  Diagnosis Date  . Ectopic pregnancy of right ovary   . Lupus ([place])   . Other malaise and fatigue   . Other symptoms involving skin and integumentary tissues   . Palpitations   . Raynaud's syndrome   . Skin cancer    Past Surgical History:  Procedure Laterality Date  . ABLATION     Uterine  . right salpingectomy    . TUBAL LIGATION    . WISDOM TOOTH EXTRACTION     Social History   Socioeconomic History  . Marital status: Legally Separated    Spouse name: Not on file  . Number of children: Not on file  . Years of education: Not on file  . Highest education level: Not on file  Occupational History  . Not on file  Tobacco Use  . Smoking status: Never  . Smokeless tobacco: Never  Vaping Use  . Vaping Use: Never used  Substance and Sexual Activity  . Alcohol use: Yes  . Drug use: No  . Sexual activity: Not Currently    Birth control/protection: Surgical  Other Topics Concern  . Not on file  Social History  Narrative  . Not on file   Social Determinants of Health   Financial Resource Strain: Not on file  Food Insecurity: Not on file  Transportation Needs: Not on file  Physical Activity: Not on file  Stress: Not on file  Social Connections: Not on file   Family History  Problem Relation Age of Onset  . Heart Problems Other        grandmother  . Heart disease Paternal Grandfather   . Heart attack Paternal Grandfather    No Known Allergies Current Outpatient Medications  Medication Sig Dispense Refill  . alfuzosin (UROXATRAL) 10 MG 24 hr tablet Take 10 mg by mouth daily with breakfast.    . LYSINE ACETATE PO Take by mouth.    Marland Kitchen METAMUCIL FIBER PO Take by mouth.    Derrill Memo ON 09/23/2022] methylphenidate (CONCERTA) 54 MG PO CR tablet Take 1 tablet (54 mg total) by mouth every morning. 30 tablet 0  . methylphenidate (CONCERTA) 54 MG PO CR tablet Take 1 tablet (54 mg total) by mouth every morning. 30 tablet 0  . [START ON 10/23/2022] methylphenidate (CONCERTA) 54 MG PO CR tablet Take  1 tablet (54 mg total) by mouth every morning. 30 tablet 0  . Multiple Vitamin (MULTIVITAMIN) tablet Take 1 tablet by mouth daily.    . QUEtiapine (SEROQUEL) 25 MG tablet Take 1 tablet (25 mg total) by mouth at bedtime. 90 tablet 1  . traMADol (ULTRAM) 50 MG tablet Take 1 tablet (50 mg total) by mouth every 8 (eight) hours as needed for moderate pain. 21 tablet 0   No current facility-administered medications for this visit.   No results found.  Review of Systems:   A ROS was performed including pertinent positives and negatives as documented in the HPI.  Physical Exam :   Constitutional: NAD and appears stated age Neurological: Alert and oriented Psych: Appropriate affect and cooperative There were no vitals taken for this visit.   Comprehensive Musculoskeletal Exam:    Inspection Right Left  Skin No atrophy or gross abnormalities appreciated No atrophy or gross abnormalities appreciated  Palpation     Tenderness None None  Crepitus None None  Range of Motion    Flexion (passive) 120 120  Extension 30 30  IR 45 45  ER 50 50  Strength    Flexion  5/5 5/5  Extension 5/5 5/5  Special Tests    FABER Negative Negative  FADIR Negative Negative  ER Lag/Capsular Insufficiency Negative Negative  Instability Negative Negative  Sacroiliac pain Negative  Negative   Instability    Generalized Laxity No No  Neurologic    sciatic, femoral, obturator nerves intact to light sensation  Vascular/Lymphatic    DP pulse 2+ 2+  Lumbar Exam    Patient has symmetric lumbar range of motion with negative pain referral to hip   She has pain with resisted abduction that does refer to the trochanter on both sides  Imaging:   Xray (AP pelvis, left hip 2 views): Normal  MRI (left hip): There is a very small anterior superior labral tear.  There is fluid around the gluteus minimus tendon consistent with tendinosis  I personally reviewed and interpreted the radiographs.   Assessment:   47 y.o. female with lateral based hip pain left worse than right which has been ongoing now for many months.  Today's visit I did describe that I do believe that her gluteal muscles and tendons are what is causing the majority of her symptoms.  Side effect I recommended bilateral ultrasound-guided gluteus injections that we can perform today.  I do believe that ultimately this in conjunction with a strong maintenance program would likely give her prolonged relief as she does not have any discrete tearing of the tendons.  I would like her to send me a MyChart message early next week that I can ascertain how much relief she gets from these initial gluteus injections.  If relief is near complete we will continue to work according to this rehab protocol  Plan :    -Bilateral ultrasound-guided gluteus injections performed today after verbal consent    Procedure Note  Patient: Amber Lawrence             Date of Birth:  [birth year]           MRN: [id number]             Visit Date: 09/22/2022  Procedures: Visit Diagnoses: No diagnosis found.  Large Joint Inj: R greater trochanter on 09/22/2022 8:50 AM Indications: pain Details: 22 G 3.5 in needle, ultrasound-guided anterolateral approach  Arthrogram: No  Medications: 4 mL lidocaine 1 %; 80 mg  triamcinolone acetonide 40 MG/ML Outcome: tolerated well, no immediate complications Procedure, treatment alternatives, risks and benefits explained, specific risks discussed. Consent was given by the patient. Immediately prior to procedure a time out was called to verify the correct patient, procedure, equipment, support staff and site/side marked as required. Patient was prepped and draped in the usual sterile fashion.    Large Joint Inj: L greater trochanter on 09/22/2022 8:50 AM Indications: pain Details: 22 G 3.5 in needle, ultrasound-guided anterolateral approach  Arthrogram: No  Medications: 4 mL lidocaine 1 %; 80 mg triamcinolone acetonide 40 MG/ML Outcome: tolerated well, no immediate complications Procedure, treatment alternatives, risks and benefits explained, specific risks discussed. Consent was given by the patient. Immediately prior to procedure a time out was called to verify the correct patient, procedure, equipment, support staff and site/side marked as required. Patient was prepped and draped in the usual sterile fashion.          I personally saw and evaluated the patient, and participated in the management and treatment plan.  Vanetta Mulders, MD Attending Physician, Orthopedic Surgery  This document was dictated using Dragon voice recognition software. A reasonable attempt at proof reading has been made to minimize errors.

## 2022-09-28 ENCOUNTER — Encounter: Payer: Self-pay | Admitting: Gastroenterology

## 2022-10-04 ENCOUNTER — Other Ambulatory Visit: Payer: Self-pay | Admitting: Medical-Surgical

## 2022-10-04 DIAGNOSIS — F902 Attention-deficit hyperactivity disorder, combined type: Secondary | ICD-10-CM

## 2022-10-04 NOTE — Telephone Encounter (Signed)
Duplicate refill order.

## 2022-10-25 ENCOUNTER — Encounter: Payer: Self-pay | Admitting: Gastroenterology

## 2022-10-25 ENCOUNTER — Ambulatory Visit (AMBULATORY_SURGERY_CENTER): Payer: BC Managed Care – PPO | Admitting: *Deleted

## 2022-10-25 VITALS — Ht 69.5 in | Wt 155.0 lb

## 2022-10-25 DIAGNOSIS — Z1211 Encounter for screening for malignant neoplasm of colon: Secondary | ICD-10-CM

## 2022-10-25 MED ORDER — NA SULFATE-K SULFATE-MG SULF 17.5-3.13-1.6 GM/177ML PO SOLN: 1.0000 | Freq: Once | ORAL | 0 refills | Status: AC

## 2022-10-25 NOTE — Progress Notes (Signed)

## 2022-11-14 ENCOUNTER — Other Ambulatory Visit: Payer: Self-pay | Admitting: Medical-Surgical

## 2022-11-14 DIAGNOSIS — F902 Attention-deficit hyperactivity disorder, combined type: Secondary | ICD-10-CM

## 2022-11-14 MED ORDER — METHYLPHENIDATE HCL ER (OSM) 54 MG PO TBCR: 54.0000 mg | EXTENDED_RELEASE_TABLET | ORAL | 0 refills | Status: DC

## 2022-11-14 NOTE — Telephone Encounter (Signed)
30-day supply sent to pharmacy.  Due for follow-up appointment for ADHD.  Please contact patient to facilitate scheduling.

## 2022-11-15 ENCOUNTER — Encounter: Payer: BC Managed Care – PPO | Admitting: Gastroenterology

## 2022-11-15 NOTE — Telephone Encounter (Signed)
Lvm for patient to call back to schedule an appointment with Joy for follow-up appointment for ADHD. Tvt

## 2022-11-25 ENCOUNTER — Encounter: Payer: Self-pay | Admitting: Gastroenterology

## 2022-11-25 ENCOUNTER — Ambulatory Visit (AMBULATORY_SURGERY_CENTER): Payer: BC Managed Care – PPO | Admitting: Gastroenterology

## 2022-11-25 VITALS — BP 100/64 | HR 49 | Temp 98.4°F | Resp 11 | Ht 69.5 in | Wt 155.0 lb

## 2022-11-25 DIAGNOSIS — Z1211 Encounter for screening for malignant neoplasm of colon: Secondary | ICD-10-CM

## 2022-11-25 MED ORDER — SODIUM CHLORIDE 0.9 % IV SOLN: 500.0000 mL | Freq: Once | INTRAVENOUS | Status: DC

## 2022-11-25 NOTE — Patient Instructions (Signed)
Impression/Recommendations:  Diverticulosis handout given to patient.  Resume previous diet. Continue present medications.  Repeat colonoscopy in 10 years for screening purposes.  YOU HAD AN ENDOSCOPIC PROCEDURE TODAY AT THE [facility]:   Refer to the procedure report that was given to you for any specific questions about what was found during the examination.  If the procedure report does not answer your questions, please call your gastroenterologist to clarify.  If you requested that your care partner not be given the details of your procedure findings, then the procedure report has been included in a sealed envelope for you to review at your convenience later.  YOU SHOULD EXPECT: Some feelings of bloating in the abdomen. Passage of more gas than usual.  Walking can help get rid of the air that was put into your GI tract during the procedure and reduce the bloating. If you had a lower endoscopy (such as a colonoscopy or flexible sigmoidoscopy) you may notice spotting of blood in your stool or on the toilet paper. If you underwent a bowel prep for your procedure, you may not have a normal bowel movement for a few days.  Please Note:  You might notice some irritation and congestion in your nose or some drainage.  This is from the oxygen used during your procedure.  There is no need for concern and it should clear up in a day or so.  SYMPTOMS TO REPORT IMMEDIATELY:  Following lower endoscopy (colonoscopy or flexible sigmoidoscopy):  Excessive amounts of blood in the stool  Significant tenderness or worsening of abdominal pains  Swelling of the abdomen that is new, acute  Fever of 100F or higher  For urgent or emergent issues, a gastroenterologist can be reached at any hour by calling [phone]. Do not use MyChart messaging for urgent concerns.    DIET:  We do recommend a small meal at first, but then you may proceed to your regular diet.  Drink plenty of fluids but you  should avoid alcoholic beverages for 24 hours.  ACTIVITY:  You should plan to take it easy for the rest of today and you should NOT DRIVE or use heavy machinery until tomorrow (because of the sedation medicines used during the test).    FOLLOW UP: Our staff will call the number listed on your records the next business day following your procedure.  We will call around 7:15- 8:00 am to check on you and address any questions or concerns that you may have regarding the information given to you following your procedure. If we do not reach you, we will leave a message.     If any biopsies were taken you will be contacted by phone or by letter within the next 1-3 weeks.  Please call us at [phone] if you have not heard about the biopsies in 3 weeks.    SIGNATURES/CONFIDENTIALITY: You and/or your care partner have signed paperwork which will be entered into your electronic medical record.  These signatures attest to the fact that that the information above on your After Visit Summary has been reviewed and is understood.  Full responsibility of the confidentiality of this discharge information lies with you and/or your care-partner.

## 2022-11-25 NOTE — Progress Notes (Signed)
VS completed by DT.  Pt's states no medical or surgical changes since previsit or office visit.  

## 2022-11-25 NOTE — Progress Notes (Signed)
History and Physical:  This patient presents for endoscopic testing for: Encounter Diagnosis  Name Primary?   Special screening for malignant neoplasms, colon Yes    Average risk for colorectal cancer.  First screening exam. Patient denies chronic abdominal pain, rectal bleeding, constipation or diarrhea.   Patient is otherwise without complaints or active issues today.   Past Medical History: Past Medical History:  Diagnosis Date   Ectopic pregnancy of right ovary    Lupus (HCC)    Other malaise and fatigue    Other symptoms involving skin and integumentary tissues    Palpitations    Raynaud's syndrome    Seizures (HCC)    as a child age 35 last seizure updated 10/25/22   Skin cancer      Past Surgical History: Past Surgical History:  Procedure Laterality Date   ABLATION  2010   Uterine   right salpingectomy     2012,with tubal ligation   STERIOD INJECTION Bilateral    feb 2024   TUBAL LIGATION  2012   WISDOM TOOTH EXTRACTION  2000    Allergies: No Known Allergies  Outpatient Meds: Current Outpatient Medications  Medication Sig Dispense Refill   COLLAGEN PO Take by mouth. 40 grams daily     Creatine POWD by Does not apply route. Take 5 grams daily     LYSINE ACETATE PO Take 1,000 mg by mouth.     methylphenidate (CONCERTA) 54 MG PO CR tablet Take 1 tablet (54 mg total) by mouth every morning. NEEDS APPT FOR FURTHER REFILLS 30 tablet 0   Multiple Vitamin (MULTIVITAMIN) tablet Take 1 tablet by mouth daily.     Vitamin D-Vitamin K (VITAMIN K2-VITAMIN D3 PO) Take by mouth daily. Take one pill daily     Berberine Chloride (BERBERINE HCI PO) Take 400 mg by mouth daily.     QUEtiapine (SEROQUEL) 25 MG tablet Take 1 tablet (25 mg total) by mouth at bedtime. (Patient taking differently: Take 25 mg by mouth as needed.) 90 tablet 1   traMADol (ULTRAM) 50 MG tablet Take 1 tablet (50 mg total) by mouth every 8 (eight) hours as needed for moderate pain. (Patient not taking:  Reported on 11/25/2022) 21 tablet 0   Current Facility-Administered Medications  Medication Dose Route Frequency Provider Last Rate Last Admin   0.9 %  sodium chloride infusion  500 mL Intravenous Once Danis, Andreas Blower, MD          ___________________________________________________________________ Objective   Exam:  BP 129/70   Pulse (!) 58   Temp 98.4 F (36.9 C) (Temporal)   Resp (!) 9   Ht 5' 9.5" (1.765 m)   Wt 155 lb (70.3 kg)   SpO2 100%   BMI 22.56 kg/m   CV: regular , S1/S2 Resp: clear to auscultation bilaterally, normal RR and effort noted GI: soft, no tenderness, with active bowel sounds.   Assessment: Encounter Diagnosis  Name Primary?   Special screening for malignant neoplasms, colon Yes     Plan: Colonoscopy  The benefits and risks of the planned procedure were described in detail with the patient or (when appropriate) their health care proxy.  Risks were outlined as including, but not limited to, bleeding, infection, perforation, adverse medication reaction leading to cardiac or pulmonary decompensation, pancreatitis (if ERCP).  The limitation of incomplete mucosal visualization was also discussed.  No guarantees or warranties were given.    The patient is appropriate for an endoscopic procedure in the ambulatory setting.   -  Wilfrid Lund, MD

## 2022-11-25 NOTE — Progress Notes (Signed)
Uneventful anesthetic. Report to pacu rn. Vss. Care resumed by rn. 

## 2022-11-25 NOTE — Op Note (Signed)
[facility] Patient Name: Amber Lawrence Procedure Date: 11/25/2022 1:10 PM MRN: [id number] Endoscopist: Sherilyn Cooter L. Myrtie Neither , MD, [id number] Age: 47 Referring MD:  Date of Birth: July 31, 1976 Gender: Female Account #: [id number] Procedure:                Colonoscopy Indications:              Screening for colorectal malignant neoplasm, This                            is the patient's first colonoscopy Medicines:                Monitored Anesthesia Care Procedure:                Pre-Anesthesia Assessment:                           - Prior to the procedure, a History and Physical                            was performed, and patient medications and                            allergies were reviewed. The patient's tolerance of                            previous anesthesia was also reviewed. The risks                            and benefits of the procedure and the sedation                            options and risks were discussed with the patient.                            All questions were answered, and informed consent                            was obtained. Prior Anticoagulants: The patient has                            taken no anticoagulant or antiplatelet agents. ASA                            Grade Assessment: II - A patient with mild systemic                            disease. After reviewing the risks and benefits,                            the patient was deemed in satisfactory condition to                            undergo the procedure.  After obtaining informed consent, the colonoscope                            was passed under direct vision. Throughout the                            procedure, the patient's blood pressure, pulse, and                            oxygen saturations were monitored continuously. The                            CF [id number] #[id number] was introduced through the anus                            and advanced to the the  cecum, identified by                            appendiceal orifice and ileocecal valve. The                            colonoscopy was performed without difficulty. The                            patient tolerated the procedure well. The quality                            of the bowel preparation was excellent. The                            ileocecal valve, appendiceal orifice, and rectum                            were photographed. Scope In: 1:12:03 PM Scope Out: 1:25:12 PM Scope Withdrawal Time: 0 hours 9 minutes 46 seconds  Total Procedure Duration: 0 hours 13 minutes 9 seconds  Findings:                 The perianal and digital rectal examinations were                            normal.                           Repeat examination of right colon under NBI                            performed.                           A few diverticula were found in the left colon.                           An area of melanosis was found in the entire colon.  The exam was otherwise without abnormality on                            direct and retroflexion views. Complications:            No immediate complications. Estimated Blood Loss:     Estimated blood loss: none. Impression:               - Diverticulosis in the left colon.                           - Melanosis in the colon.                           - The examination was otherwise normal on direct                            and retroflexion views.                           - No specimens collected. Recommendation:           - Patient has a contact number available for                            emergencies. The signs and symptoms of potential                            delayed complications were discussed with the                            patient. Return to normal activities tomorrow.                            Written discharge instructions were provided to the                            patient.                            - Resume previous diet.                           - Continue present medications.                           - Repeat colonoscopy in 10 years for screening                            purposes. Sharmin Foulk L. Myrtie Neither, MD 11/25/2022 1:28:22 PM This report has been signed electronically.

## 2022-11-28 ENCOUNTER — Telehealth: Payer: Self-pay

## 2022-11-28 NOTE — Telephone Encounter (Signed)
  Follow up Call-     11/25/2022   12:41 PM  Call back number  Post procedure Call Back phone  # [phone]  Permission to leave phone message Yes     Patient questions:  Do you have a fever, pain , or abdominal swelling? No. Pain Score  0 *  Have you tolerated food without any problems? Yes.    Have you been able to return to your normal activities? Yes.    Do you have any questions about your discharge instructions: Diet   No. Medications  No. Follow up visit  No.  Do you have questions or concerns about your Care? No.  Actions: * If pain score is 4 or above: No action needed, pain <4.

## 2022-12-15 DIAGNOSIS — Z85820 Personal history of malignant melanoma of skin: Secondary | ICD-10-CM | POA: Diagnosis not present

## 2022-12-15 DIAGNOSIS — L821 Other seborrheic keratosis: Secondary | ICD-10-CM | POA: Diagnosis not present

## 2022-12-15 DIAGNOSIS — D225 Melanocytic nevi of trunk: Secondary | ICD-10-CM | POA: Diagnosis not present

## 2022-12-15 DIAGNOSIS — L814 Other melanin hyperpigmentation: Secondary | ICD-10-CM | POA: Diagnosis not present

## 2022-12-16 ENCOUNTER — Encounter: Payer: BC Managed Care – PPO | Admitting: Medical-Surgical

## 2022-12-22 ENCOUNTER — Ambulatory Visit (INDEPENDENT_AMBULATORY_CARE_PROVIDER_SITE_OTHER): Payer: BC Managed Care – PPO | Admitting: Medical-Surgical

## 2022-12-22 ENCOUNTER — Encounter: Payer: Self-pay | Admitting: Medical-Surgical

## 2022-12-22 VITALS — BP 149/74 | HR 68 | Ht 69.5 in | Wt 170.0 lb

## 2022-12-22 DIAGNOSIS — M329 Systemic lupus erythematosus, unspecified: Secondary | ICD-10-CM

## 2022-12-22 DIAGNOSIS — Z13220 Encounter for screening for lipoid disorders: Secondary | ICD-10-CM

## 2022-12-22 DIAGNOSIS — Z0000 Encounter for general adult medical examination without abnormal findings: Secondary | ICD-10-CM | POA: Diagnosis not present

## 2022-12-22 DIAGNOSIS — F902 Attention-deficit hyperactivity disorder, combined type: Secondary | ICD-10-CM

## 2022-12-22 DIAGNOSIS — Z1231 Encounter for screening mammogram for malignant neoplasm of breast: Secondary | ICD-10-CM

## 2022-12-22 LAB — CBC WITH DIFFERENTIAL/PLATELET
Basophils Absolute: 82 cells/uL (ref 0–200)
Eosinophils Absolute: 233 cells/uL (ref 15–500)
MPV: 11.1 fL (ref 7.5–12.5)
Total Lymphocyte: 35.7 %
WBC: 6.3 10*3/uL (ref 3.8–10.8)

## 2022-12-22 MED ORDER — METHYLPHENIDATE HCL ER (OSM) 54 MG PO TBCR
54.0000 mg | EXTENDED_RELEASE_TABLET | ORAL | 0 refills | Status: DC
Start: 2022-12-22 — End: 2023-05-25

## 2022-12-22 MED ORDER — METHYLPHENIDATE HCL ER (OSM) 54 MG PO TBCR: 54.0000 mg | EXTENDED_RELEASE_TABLET | ORAL | 0 refills | Status: DC

## 2022-12-22 MED ORDER — METHYLPHENIDATE HCL ER (OSM) 54 MG PO TBCR
54.0000 mg | EXTENDED_RELEASE_TABLET | ORAL | 0 refills | Status: DC
Start: 2023-02-20 — End: 2023-05-25

## 2022-12-22 NOTE — Progress Notes (Signed)
Complete physical exam  Patient: Amber Lawrence   DOB: 11-19-1975   47 y.o. Female  MRN: [id number]  Subjective:    No chief complaint on file.  Amber Lawrence is a 47 y.o. female who presents today for a complete physical exam. She reports consuming a general diet. Gym/ health club routine includes cardio and mod to heavy weightlifting. She generally feels well. She reports sleeping fairly well. She does not have additional problems to discuss today.   Most recent fall risk assessment:    08/24/2022   10:18 AM  Fall Risk   Falls in the past year? 0  Number falls in past yr: 0  Injury with Fall? 0  Risk for fall due to : No Fall Risks  Follow up Falls evaluation completed     Most recent depression screenings:    08/24/2022   10:18 AM 06/17/2022    3:50 PM  PHQ 2/9 Scores  PHQ - 2 Score 0 0  PHQ- 9 Score  4    Vision:Within last year, Dental: No current dental problems and Receives regular dental care, and STD: The patient denies history of sexually transmitted disease.    Patient Care Team: Christen Butter, NP as PCP - General (Nurse Practitioner)   Outpatient Medications Prior to Visit  Medication Sig   Berberine Chloride (BERBERINE HCI PO) Take 400 mg by mouth daily.   COLLAGEN PO Take by mouth. 40 grams daily   Creatine POWD by Does not apply route. Take 5 grams daily   LYSINE ACETATE PO Take 1,000 mg by mouth.   Multiple Vitamin (MULTIVITAMIN) tablet Take 1 tablet by mouth daily.   QUEtiapine (SEROQUEL) 25 MG tablet Take 1 tablet (25 mg total) by mouth at bedtime. (Patient taking differently: Take 25 mg by mouth as needed.)   traMADol (ULTRAM) 50 MG tablet Take 1 tablet (50 mg total) by mouth every 8 (eight) hours as needed for moderate pain.   Vitamin D-Vitamin K (VITAMIN K2-VITAMIN D3 PO) Take by mouth daily. Take one pill daily   [DISCONTINUED] methylphenidate (CONCERTA) 54 MG PO CR tablet Take 1 tablet (54 mg total) by mouth every morning. NEEDS APPT FOR FURTHER  REFILLS   Facility-Administered Medications Prior to Visit  Medication Dose Route Frequency Provider   0.9 %  sodium chloride infusion  500 mL Intravenous Once Danis, Andreas Blower, MD    Review of Systems  Constitutional:  Positive for malaise/fatigue. Negative for chills, fever and weight loss.  HENT:  Negative for congestion, ear pain, hearing loss, sinus pain and sore throat.   Eyes:  Negative for blurred vision, photophobia and pain.  Respiratory:  Negative for cough, shortness of breath and wheezing.   Cardiovascular:  Negative for chest pain, palpitations and leg swelling.  Gastrointestinal:  Negative for abdominal pain, constipation, diarrhea, heartburn, nausea and vomiting.  Genitourinary:  Negative for dysuria, frequency and urgency.  Musculoskeletal:  Positive for joint pain (bilateral hips). Negative for falls and neck pain.  Skin:  Negative for itching and rash.  Neurological:  Negative for dizziness, weakness and headaches.  Endo/Heme/Allergies:  Negative for polydipsia. Does not bruise/bleed easily.  Psychiatric/Behavioral:  Negative for depression, substance abuse and suicidal ideas. The patient is not nervous/anxious.      Objective:     BP (!) 149/74   Pulse 68   Ht 5' 9.5" (1.765 m)   Wt 170 lb (77.1 kg)   SpO2 94%   BMI 24.74 kg/m    Physical  Exam Vitals reviewed.  Constitutional:      General: She is not in acute distress.    Appearance: Normal appearance. She is not ill-appearing.  HENT:     Head: Normocephalic and atraumatic.     Right Ear: Tympanic membrane, ear canal and external ear normal. There is no impacted cerumen.     Left Ear: Tympanic membrane, ear canal and external ear normal. There is no impacted cerumen.     Nose: Nose normal. No congestion or rhinorrhea.     Mouth/Throat:     Mouth: Mucous membranes are moist.     Pharynx: No oropharyngeal exudate or posterior oropharyngeal erythema.  Eyes:     General: No scleral icterus.        Right eye: No discharge.        Left eye: No discharge.     Extraocular Movements: Extraocular movements intact.     Conjunctiva/sclera: Conjunctivae normal.     Pupils: Pupils are equal, round, and reactive to light.  Neck:     Thyroid: No thyromegaly.     Vascular: No carotid bruit or JVD.     Trachea: Trachea normal.  Cardiovascular:     Rate and Rhythm: Normal rate and regular rhythm.     Pulses: Normal pulses.     Heart sounds: Normal heart sounds. No murmur heard.    No friction rub. No gallop.  Pulmonary:     Effort: Pulmonary effort is normal. No respiratory distress.     Breath sounds: Normal breath sounds. No wheezing.  Abdominal:     General: Bowel sounds are normal. There is no distension.     Palpations: Abdomen is soft.     Tenderness: There is no abdominal tenderness. There is no guarding.  Musculoskeletal:        General: Normal range of motion.     Cervical back: Normal range of motion and neck supple.  Lymphadenopathy:     Cervical: No cervical adenopathy.  Skin:    General: Skin is warm and dry.  Neurological:     Mental Status: She is alert and oriented to person, place, and time.     Cranial Nerves: No cranial nerve deficit.  Psychiatric:        Mood and Affect: Mood normal.        Behavior: Behavior normal.        Thought Content: Thought content normal.        Judgment: Judgment normal.      No results found for any visits on 12/22/22.     Assessment & Plan:    Routine Health Maintenance and Physical Exam  There is no immunization history on file for this patient.  Health Maintenance  Topic Date Due   COVID-19 Vaccine (1) 01/07/2023 (Originally 02/22/1981)   INFLUENZA VACCINE  03/02/2023   PAP SMEAR-Modifier  10/28/2023   Colonoscopy  11/24/2032   Hepatitis C Screening  Completed   HIV Screening  Completed   HPV VACCINES  Aged Out   DTaP/Tdap/Td  Discontinued    Discussed health benefits of physical activity, and encouraged her to  engage in regular exercise appropriate for her age and condition.  1. Annual physical exam Checking labs as below.  Up-to-date on preventative care.  Wellness information provided with AVS. - Lipid panel - COMPLETE METABOLIC PANEL WITH GFR - CBC with Differential/Platelet  2. Lipid screening Checking lipids today. -Lipid panel  3. Encounter for screening mammogram for malignant neoplasm of breast Mammogram ordered. -  MM DIGITAL SCREENING BILATERAL; Future  4. Attention deficit hyperactivity disorder (ADHD), combined type Doing very well on her current regimen.  Continue Concerta 54 mg daily as prescribed. - methylphenidate (CONCERTA) 54 MG PO CR tablet; Take 1 tablet (54 mg total) by mouth every morning.  Dispense: 30 tablet; Refill: 0 - methylphenidate (CONCERTA) 54 MG PO CR tablet; Take 1 tablet (54 mg total) by mouth every morning.  Dispense: 30 tablet; Refill: 0 - methylphenidate (CONCERTA) 54 MG PO CR tablet; Take 1 tablet (54 mg total) by mouth every morning.  Dispense: 30 tablet; Refill: 0  5. Lupus (HCC) Currently managed with lifestyle/dietary modifications.  Has not been on any medications and is no longer following up with a specialist.  No current symptoms of concern.  No referral desired.  Return in about 3 months (around 03/24/2023) for ADHD follow up.     Christen Butter, NP

## 2022-12-23 LAB — CBC WITH DIFFERENTIAL/PLATELET
Absolute Monocytes: 473 cells/uL (ref 200–950)
Basophils Relative: 1.3 %
Eosinophils Relative: 3.7 %
HCT: 39.6 % (ref 35.0–45.0)
Hemoglobin: 13 g/dL (ref 11.7–15.5)
Lymphs Abs: 2249 cells/uL (ref 850–3900)
MCH: 31.1 pg (ref 27.0–33.0)
MCHC: 32.8 g/dL (ref 32.0–36.0)
MCV: 94.7 fL (ref 80.0–100.0)
Monocytes Relative: 7.5 %
Neutro Abs: 3263 cells/uL (ref 1500–7800)
Neutrophils Relative %: 51.8 %
Platelets: 237 10*3/uL (ref 140–400)
RBC: 4.18 10*6/uL (ref 3.80–5.10)
RDW: 11.8 % (ref 11.0–15.0)

## 2022-12-23 LAB — COMPLETE METABOLIC PANEL WITH GFR
AG Ratio: 1.7 (calc) (ref 1.0–2.5)
ALT: 20 U/L (ref 6–29)
AST: 21 U/L (ref 10–35)
Albumin: 4.3 g/dL (ref 3.6–5.1)
Alkaline phosphatase (APISO): 49 U/L (ref 31–125)
BUN: 10 mg/dL (ref 7–25)
CO2: 29 mmol/L (ref 20–32)
Calcium: 9 mg/dL (ref 8.6–10.2)
Chloride: 102 mmol/L (ref 98–110)
Creat: 0.86 mg/dL (ref 0.50–0.99)
Globulin: 2.5 g/dL (calc) (ref 1.9–3.7)
Glucose, Bld: 84 mg/dL (ref 65–99)
Potassium: 4 mmol/L (ref 3.5–5.3)
Sodium: 138 mmol/L (ref 135–146)
Total Bilirubin: 0.5 mg/dL (ref 0.2–1.2)
Total Protein: 6.8 g/dL (ref 6.1–8.1)
eGFR: 85 mL/min/{1.73_m2} (ref >=60)

## 2022-12-23 LAB — LIPID PANEL
Cholesterol: 176 mg/dL (ref <200)
HDL: 92 mg/dL (ref >=50)
LDL Cholesterol (Calc): 68 mg/dL (calc)
Non-HDL Cholesterol (Calc): 84 mg/dL (calc) (ref <130)
Total CHOL/HDL Ratio: 1.9 (calc) (ref <5.0)
Triglycerides: 82 mg/dL (ref <150)

## 2023-02-07 ENCOUNTER — Other Ambulatory Visit: Payer: Self-pay | Admitting: Medical-Surgical

## 2023-02-07 ENCOUNTER — Encounter: Payer: Self-pay | Admitting: Medical-Surgical

## 2023-02-07 DIAGNOSIS — F902 Attention-deficit hyperactivity disorder, combined type: Secondary | ICD-10-CM

## 2023-02-18 ENCOUNTER — Other Ambulatory Visit: Payer: Self-pay | Admitting: Medical-Surgical

## 2023-05-25 ENCOUNTER — Encounter: Payer: Self-pay | Admitting: Medical-Surgical

## 2023-05-25 ENCOUNTER — Ambulatory Visit (INDEPENDENT_AMBULATORY_CARE_PROVIDER_SITE_OTHER): Payer: BC Managed Care – PPO | Admitting: Medical-Surgical

## 2023-05-25 VITALS — BP 105/65 | HR 79 | Resp 20 | Ht 69.5 in | Wt 158.1 lb

## 2023-05-25 DIAGNOSIS — Z113 Encounter for screening for infections with a predominantly sexual mode of transmission: Secondary | ICD-10-CM

## 2023-05-25 DIAGNOSIS — F902 Attention-deficit hyperactivity disorder, combined type: Secondary | ICD-10-CM

## 2023-05-25 MED ORDER — METHYLPHENIDATE HCL ER (OSM) 54 MG PO TBCR
54.0000 mg | EXTENDED_RELEASE_TABLET | ORAL | 0 refills | Status: DC
Start: 2023-07-24 — End: 2023-11-23

## 2023-05-25 MED ORDER — METHYLPHENIDATE HCL ER (OSM) 54 MG PO TBCR: 54.0000 mg | EXTENDED_RELEASE_TABLET | ORAL | 0 refills | Status: DC

## 2023-05-25 MED ORDER — METHYLPHENIDATE HCL ER (OSM) 54 MG PO TBCR
54.0000 mg | EXTENDED_RELEASE_TABLET | ORAL | 0 refills | Status: DC
Start: 2023-06-24 — End: 2023-11-23

## 2023-05-25 NOTE — Progress Notes (Signed)
        Established patient visit  History, exam, impression, and plan:  1. Routine screening for STI (sexually transmitted infection) Very pleasant 47 year old female presenting today for routine screening for STI.  She is in a new relationship and has been with her partner for the last 2 months.  Her partner is female and they do use condoms with every sexual encounter.  He gets tested regularly and has had no positive results.  No concerning symptoms today.  Checking labs as below. - RPR - Hepatitis B surface antigen - Hepatitis C antibody - HIV Antibody (routine testing w rflx) - NuSwab Vaginitis Plus (VG+)  2. Attention deficit hyperactivity disorder (ADHD), combined type History of ADHD that is currently treated with Concerta 54 mg once daily.  Takes this Monday through Friday and skips weekends.  Feels the medication works fairly well although she has some times when it is not as effective.  Denies any concerns with side effects, difficulty sleeping, weight fluctuations, or appetite suppression.  Happy with the results of the medication and does not have a desire to change.  Continue Concerta 54 mg daily as needed.  Plan to follow-up on this in 6 months. - methylphenidate (CONCERTA) 54 MG PO CR tablet; Take 1 tablet (54 mg total) by mouth every morning.  Dispense: 30 tablet; Refill: 0 - methylphenidate (CONCERTA) 54 MG PO CR tablet; Take 1 tablet (54 mg total) by mouth every morning.  Dispense: 30 tablet; Refill: 0 - methylphenidate (CONCERTA) 54 MG PO CR tablet; Take 1 tablet (54 mg total) by mouth every morning.  Dispense: 30 tablet; Refill: 0  Procedures performed this visit: None.  Return in about 6 months (around 11/23/2023) for ADHD follow up.  __________________________________ Thayer Ohm, DNP, APRN, FNP-BC Primary Care and Sports Medicine [facility] [place]

## 2023-05-26 LAB — RPR: RPR Ser Ql: NONREACTIVE

## 2023-05-26 LAB — HEPATITIS B SURFACE ANTIGEN: Hepatitis B Surface Ag: NEGATIVE

## 2023-05-26 LAB — HIV ANTIBODY (ROUTINE TESTING W REFLEX): HIV Screen 4th Generation wRfx: NONREACTIVE

## 2023-05-26 LAB — HEPATITIS C ANTIBODY: Hep C Virus Ab: NONREACTIVE

## 2023-05-29 LAB — NUSWAB VAGINITIS PLUS (VG+)
Candida albicans, NAA: NEGATIVE
Candida glabrata, NAA: NEGATIVE
Chlamydia trachomatis, NAA: NEGATIVE
Neisseria gonorrhoeae, NAA: NEGATIVE
Trich vag by NAA: NEGATIVE

## 2023-08-18 ENCOUNTER — Other Ambulatory Visit: Payer: Self-pay | Admitting: Medical-Surgical

## 2023-09-17 DIAGNOSIS — J0100 Acute maxillary sinusitis, unspecified: Secondary | ICD-10-CM | POA: Diagnosis not present

## 2023-10-23 ENCOUNTER — Encounter: Payer: Self-pay | Admitting: Medical-Surgical

## 2023-10-23 DIAGNOSIS — F902 Attention-deficit hyperactivity disorder, combined type: Secondary | ICD-10-CM

## 2023-10-23 MED ORDER — METHYLPHENIDATE HCL ER (OSM) 54 MG PO TBCR: 54.0000 mg | EXTENDED_RELEASE_TABLET | ORAL | 0 refills | Status: DC

## 2023-10-23 NOTE — Telephone Encounter (Signed)
 Patient requesting rx rf of Concerta Last written 07/24/2023 Last OV 05/25/2023 Upcoming appt 10/31/23 video visit Please see attached message from patient requesting a refill until upcoming appt on 10/31/23.

## 2023-10-26 ENCOUNTER — Telehealth: Admitting: Medical-Surgical

## 2023-10-26 ENCOUNTER — Ambulatory Visit: Payer: Self-pay

## 2023-10-26 DIAGNOSIS — F902 Attention-deficit hyperactivity disorder, combined type: Secondary | ICD-10-CM

## 2023-10-26 MED ORDER — METHYLPHENIDATE HCL ER (OSM) 36 MG PO TBCR: 72.0000 mg | EXTENDED_RELEASE_TABLET | Freq: Every day | ORAL | 0 refills | Status: DC

## 2023-10-26 NOTE — Telephone Encounter (Signed)
 Pharmacist Vernona Rieger @ [facility] contacted to state she received a prescription request for Methylphenidate 36 mg today, but patient just picked up the Methylphenidate 54 mg two days ago. Vernona Rieger is asking if she should still fill this and request patient to return other prescription to our office. Vernona Rieger would like further instruction from PCP. You can contact Vernona Rieger directly @ [phone].   Copied from CRM [id number]. Topic: Clinical - Medication Question >> Oct 26, 2023 12:25 PM Abundio Miu S wrote: Reason for CRM: Patient's pharmacy requesting to speak with nurse in regard to medication. Reason for Disposition  [1] Caller requests to speak ONLY to PCP AND [2] NON-URGENT question  Answer Assessment - Initial Assessment Questions 1. REASON FOR CALL or QUESTION: "What is your reason for calling today?" or "How can I best help you?" or "What question do you have that I can help answer?"     Please see notes 2. CALLER: Document the source of call. (e.g., laboratory, patient).     Pharmacy  Protocols used: PCP Call - No Triage-A-AH

## 2023-10-26 NOTE — Telephone Encounter (Signed)
 Yes, she can go ahead and fill the medication and ask the patient to return the 54mg  dose that she has left to the pharmacy. She should not bring the old dose to the PCP office.  Thanks, Ander Slade

## 2023-10-27 ENCOUNTER — Encounter: Payer: Self-pay | Admitting: Medical-Surgical

## 2023-10-27 NOTE — Progress Notes (Signed)
 Virtual Visit via Video Note  I connected with Amber Lawrence on 10/27/23 at 10:50 AM EDT by a video enabled telemedicine application and verified that I am speaking with the correct person using two identifiers.   I discussed the limitations of evaluation and management by telemedicine and the availability of in person appointments. The patient expressed understanding and agreed to proceed.  Patient location: home Provider locations: office  Subjective:    CC: ADHD  HPI: Very pleasant 48 year old female presenting today via MyChart video visit with a history of ADHD currently treated with Concerta 54 mg daily.  She has been on this while and reports that the originally worked well for her.  Over the last couple of months, she has noted that the effectiveness has changed.  She takes her dose no later than 9 AM but by 2 PM, she is experiencing follow-up symptoms with an creased distractibility, increased restlessness, decreased focus, brain fog, and sleepiness.  She has tried multiple things to boost efficiency but has not been able to find a good remedy.  Interested in increasing her dose if possible.  Denies SE.  Past medical history, Surgical history, Family history not pertinant except as noted below, Social history, Allergies, and medications have been entered into the medical record, reviewed, and corrections made.   Review of Systems: See HPI for pertinent positives and negatives.   Objective:    General: Speaking clearly in complete sentences without any shortness of breath.  Alert and oriented x3.  Normal judgment. No apparent acute distress.  Impression and Recommendations:    1. Attention deficit hyperactivity disorder (ADHD), combined type (Primary) After discussion, plan to increase Concerta to 72 mg daily which is the max dose.  If this is ineffective, we will need to consider switching to something like Vyvanse or Adderall.  Plan to follow-up closely in 4 weeks to evaluate  tolerance and response.  I discussed the assessment and treatment plan with the patient. The patient was provided an opportunity to ask questions and all were answered. The patient agreed with the plan and demonstrated an understanding of the instructions.   The patient was advised to call back or seek an in-person evaluation if the symptoms worsen or if the condition fails to improve as anticipated.  Return in about 4 weeks (around 11/23/2023) for ADHD follow up.  Thayer Ohm, DNP, APRN, FNP-BC [facility] and Sports Medicine

## 2023-10-27 NOTE — Telephone Encounter (Signed)
Pharmacy and patient advised

## 2023-10-31 ENCOUNTER — Telehealth: Admitting: Medical-Surgical

## 2023-11-03 ENCOUNTER — Other Ambulatory Visit: Payer: Self-pay | Admitting: Medical-Surgical

## 2023-11-03 DIAGNOSIS — Z1231 Encounter for screening mammogram for malignant neoplasm of breast: Secondary | ICD-10-CM

## 2023-11-16 ENCOUNTER — Encounter

## 2023-11-22 ENCOUNTER — Encounter: Payer: Self-pay | Admitting: Medical-Surgical

## 2023-11-22 DIAGNOSIS — Z0000 Encounter for general adult medical examination without abnormal findings: Secondary | ICD-10-CM | POA: Diagnosis not present

## 2023-11-22 DIAGNOSIS — Z13220 Encounter for screening for lipoid disorders: Secondary | ICD-10-CM | POA: Diagnosis not present

## 2023-11-22 DIAGNOSIS — Z113 Encounter for screening for infections with a predominantly sexual mode of transmission: Secondary | ICD-10-CM

## 2023-11-23 ENCOUNTER — Ambulatory Visit (INDEPENDENT_AMBULATORY_CARE_PROVIDER_SITE_OTHER): Payer: BC Managed Care – PPO | Admitting: Medical-Surgical

## 2023-11-23 ENCOUNTER — Encounter: Payer: Self-pay | Admitting: Medical-Surgical

## 2023-11-23 VITALS — BP 121/68 | HR 59 | Resp 20 | Ht 69.5 in | Wt 166.0 lb

## 2023-11-23 DIAGNOSIS — Z202 Contact with and (suspected) exposure to infections with a predominantly sexual mode of transmission: Secondary | ICD-10-CM

## 2023-11-23 DIAGNOSIS — F902 Attention-deficit hyperactivity disorder, combined type: Secondary | ICD-10-CM | POA: Diagnosis not present

## 2023-11-23 MED ORDER — METHYLPHENIDATE HCL ER (OSM) 36 MG PO TBCR: 72.0000 mg | EXTENDED_RELEASE_TABLET | Freq: Every day | ORAL | 0 refills | Status: DC

## 2023-11-23 NOTE — Progress Notes (Signed)
        Established patient visit  History, exam, impression, and plan:  1. Attention deficit hyperactivity disorder (ADHD), combined type (Primary) Very pleasant 48 year old female presenting today for follow-up on ADHD.  We recently increased her Concerta  dose to 72 mg daily which she is taking as prescribed, tolerating well without side effects.  Reports that the new dose of 72 mg daily is working very well for her and helping to keep her focused.  Denies sleep disturbance, appetite fluctuations, or unplanned weight loss.  Blood pressure and pulse are normal today.  Cardiopulmonary exam is normal.  Continue Concerta  72 mg daily as prescribed. - methylphenidate  (CONCERTA ) 36 MG PO CR tablet; Take 2 tablets (72 mg total) by mouth daily.  Dispense: 60 tablet; Refill: 0 - methylphenidate  (CONCERTA ) 36 MG PO CR tablet; Take 2 tablets (72 mg total) by mouth daily.  Dispense: 60 tablet; Refill: 0 - methylphenidate  (CONCERTA ) 36 MG PO CR tablet; Take 2 tablets (72 mg total) by mouth daily.  Dispense: 60 tablet; Refill: 0  2. Possible exposure to STI Notes that she and her partner of 10 months are in a wonderful relationship however they are both interested and the swing or lifestyle.  They recently began to explore and had only had a couple of encounters.  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  The second encounter involved 2 other partners however condoms were used for this.  She has become very anxious about the possible exposure to an STI.  Labs were completed yesterday for STI testing however we have not received the full results back.  She notes that she has been having frequent intercourse with her partner over the last week.  While in the shower the other night, she felt 3 small bumps on the left side of her labia.  Is very worried that this may be herpes.  On evaluation, she does have several very small  bumps along the edge of the labia on the left side.  Unable to express any fluid from them for culture.  No erythema or significant itching/pain that would be consistent with herpes.  Chaperone present during exam.  Reassurance provided.  Wants STI testing returns, I will reach out to her with an update.  Reviewed recommendations for using condoms and staying safe with all sexual interactions.   Procedures performed this visit: None.  Return in about 3 months (around 02/22/2024) for ADHD follow up.  __________________________________ Maryl Snook, DNP, APRN, FNP-BC Primary Care and Sports Medicine [facility] [place]

## 2023-11-24 ENCOUNTER — Other Ambulatory Visit: Payer: Self-pay | Admitting: Medical-Surgical

## 2023-11-24 ENCOUNTER — Encounter: Payer: Self-pay | Admitting: Medical-Surgical

## 2023-11-24 LAB — CBC WITH DIFFERENTIAL/PLATELET
Basophils Absolute: 0.1 10*3/uL (ref 0.0–0.2)
Basos: 1 %
EOS (ABSOLUTE): 0.1 10*3/uL (ref 0.0–0.4)
Eos: 3 %
Hematocrit: 39.9 % (ref 34.0–46.6)
Hemoglobin: 13.3 g/dL (ref 11.1–15.9)
Immature Grans (Abs): 0 10*3/uL (ref 0.0–0.1)
Immature Granulocytes: 0 %
Lymphocytes Absolute: 1.5 10*3/uL (ref 0.7–3.1)
Lymphs: 31 %
MCH: 32.1 pg (ref 26.6–33.0)
MCHC: 33.3 g/dL (ref 31.5–35.7)
MCV: 96 fL (ref 79–97)
Monocytes Absolute: 0.3 10*3/uL (ref 0.1–0.9)
Monocytes: 6 %
Neutrophils Absolute: 2.9 10*3/uL (ref 1.4–7.0)
Neutrophils: 59 %
Platelets: 196 10*3/uL (ref 150–450)
RBC: 4.14 x10E6/uL (ref 3.77–5.28)
RDW: 11.9 % (ref 11.7–15.4)
WBC: 5 10*3/uL (ref 3.4–10.8)

## 2023-11-24 LAB — STI PROFILE, CT/NG/TV
Chlamydia by NAA: NEGATIVE
Gonococcus by NAA: NEGATIVE
HCV Ab: NONREACTIVE
HIV Screen 4th Generation wRfx: NONREACTIVE
Hep B Core Total Ab: NEGATIVE
Hep B Surface Ab, Qual: NONREACTIVE
Hepatitis B Surface Ag: NEGATIVE
RPR Ser Ql: NONREACTIVE
Trich vag by NAA: NEGATIVE

## 2023-11-24 LAB — CMP14+EGFR
ALT: 48 IU/L — ABNORMAL HIGH (ref 0–32)
AST: 45 IU/L — ABNORMAL HIGH (ref 0–40)
Albumin: 4.2 g/dL (ref 3.9–4.9)
Alkaline Phosphatase: 50 IU/L (ref 44–121)
BUN/Creatinine Ratio: 22 (ref 9–23)
BUN: 21 mg/dL (ref 6–24)
Bilirubin Total: 0.7 mg/dL (ref 0.0–1.2)
CO2: 24 mmol/L (ref 20–29)
Calcium: 8.9 mg/dL (ref 8.7–10.2)
Chloride: 102 mmol/L (ref 96–106)
Creatinine, Ser: 0.94 mg/dL (ref 0.57–1.00)
Globulin, Total: 2.4 g/dL (ref 1.5–4.5)
Glucose: 84 mg/dL (ref 70–99)
Potassium: 4.1 mmol/L (ref 3.5–5.2)
Sodium: 141 mmol/L (ref 134–144)
Total Protein: 6.6 g/dL (ref 6.0–8.5)
eGFR: 76 mL/min/{1.73_m2} (ref >59)

## 2023-11-24 LAB — LIPID PANEL
Chol/HDL Ratio: 1.8 ratio (ref 0.0–4.4)
Cholesterol, Total: 167 mg/dL (ref 100–199)
HDL: 95 mg/dL (ref >39)
LDL Chol Calc (NIH): 64 mg/dL (ref 0–99)
Triglycerides: 32 mg/dL (ref 0–149)
VLDL Cholesterol Cal: 8 mg/dL (ref 5–40)

## 2023-11-24 LAB — HSV 1 AND 2 AB, IGG
HSV 1 Glycoprotein G Ab, IgG: NONREACTIVE
HSV 2 IgG, Type Spec: NONREACTIVE

## 2023-11-24 LAB — HCV INTERPRETATION

## 2023-11-30 ENCOUNTER — Ambulatory Visit

## 2023-11-30 DIAGNOSIS — Z1231 Encounter for screening mammogram for malignant neoplasm of breast: Secondary | ICD-10-CM

## 2023-12-04 ENCOUNTER — Encounter: Payer: Self-pay | Admitting: Medical-Surgical

## 2023-12-26 ENCOUNTER — Ambulatory Visit (INDEPENDENT_AMBULATORY_CARE_PROVIDER_SITE_OTHER): Admitting: Medical-Surgical

## 2023-12-26 ENCOUNTER — Encounter: Payer: Self-pay | Admitting: Medical-Surgical

## 2023-12-26 VITALS — BP 120/68 | HR 60 | Resp 20 | Ht 69.5 in | Wt 169.1 lb

## 2023-12-26 DIAGNOSIS — L820 Inflamed seborrheic keratosis: Secondary | ICD-10-CM | POA: Insufficient documentation

## 2023-12-26 DIAGNOSIS — Z0000 Encounter for general adult medical examination without abnormal findings: Secondary | ICD-10-CM

## 2023-12-26 DIAGNOSIS — L578 Other skin changes due to chronic exposure to nonionizing radiation: Secondary | ICD-10-CM | POA: Insufficient documentation

## 2023-12-26 DIAGNOSIS — Z85820 Personal history of malignant melanoma of skin: Secondary | ICD-10-CM | POA: Insufficient documentation

## 2023-12-26 NOTE — Progress Notes (Signed)
 Complete physical exam  Patient: Amber Lawrence   DOB: 10/20/1975   48 y.o. Female  MRN: [id number]  Subjective:     Chief Complaint  Patient presents with   Annual Exam    Amber Lawrence is a 48 y.o. female who presents today for a complete physical exam. She reports consuming a general diet. Continues to exercise regularly with cardiovascular and strength training activities. She generally feels well. She reports sleeping well. She does not have additional problems to discuss today.    Most recent fall risk assessment:    08/24/2022   10:18 AM  Fall Risk   Falls in the past year? 0  Number falls in past yr: 0  Injury with Fall? 0  Risk for fall due to : No Fall Risks  Follow up Falls evaluation completed     Most recent depression screenings:    11/23/2023   11:56 AM 08/24/2022   10:18 AM  PHQ 2/9 Scores  PHQ - 2 Score 0 0    Vision:Within last year and Dental: No current dental problems    Patient Care Team: Cherre Cornish, NP as PCP - General (Nurse Practitioner)   Outpatient Medications Prior to Visit  Medication Sig   COLLAGEN PO Take by mouth. 40 grams daily   Creatine POWD by Does not apply route. Take 5 grams daily   LYSINE ACETATE PO Take 1,000 mg by mouth.   methylphenidate  (CONCERTA ) 36 MG PO CR tablet Take 2 tablets (72 mg total) by mouth daily.   methylphenidate  (CONCERTA ) 36 MG PO CR tablet Take 2 tablets (72 mg total) by mouth daily.   [START ON 01/22/2024] methylphenidate  (CONCERTA ) 36 MG PO CR tablet Take 2 tablets (72 mg total) by mouth daily.   Multiple Vitamin (MULTIVITAMIN) tablet Take 1 tablet by mouth daily.   QUEtiapine  (SEROQUEL ) 25 MG tablet TAKE 1 TABLET BY MOUTH AT BEDTIME   Resveratrol-Quercetin (RESVERATROL PLUS PO) Take by mouth.   Vitamin D-Vitamin K (VITAMIN K2-VITAMIN D3 PO) Take by mouth daily. Take one pill daily   No facility-administered medications prior to visit.    Review of Systems  Constitutional:  Negative for chills,  fever, malaise/fatigue and weight loss.  HENT:  Negative for congestion, ear pain, hearing loss, sinus pain and sore throat.   Eyes:  Negative for blurred vision, photophobia and pain.  Respiratory:  Negative for cough, shortness of breath and wheezing.   Cardiovascular:  Negative for chest pain, palpitations and leg swelling.  Gastrointestinal:  Negative for abdominal pain, constipation, diarrhea, heartburn, nausea and vomiting.  Genitourinary:  Negative for dysuria, frequency and urgency.  Musculoskeletal:  Negative for falls and neck pain.  Skin:  Negative for itching and rash.  Neurological:  Negative for dizziness, weakness and headaches.  Endo/Heme/Allergies:  Negative for polydipsia. Does not bruise/bleed easily.  Psychiatric/Behavioral:  Negative for depression, substance abuse and suicidal ideas. The patient is not nervous/anxious.           Objective:     BP 120/68 (BP Location: Left Arm, Cuff Size: Normal)   Pulse 60   Resp 20   Ht 5' 9.5" (1.765 m)   Wt 169 lb 1.3 oz (76.7 kg)   LMP 11/17/2023   SpO2 100%   BMI 24.61 kg/m    Physical Exam Vitals reviewed.  Constitutional:      General: She is not in acute distress.    Appearance: Normal appearance. She is normal weight. She is not ill-appearing.  HENT:  Head: Normocephalic and atraumatic.     Right Ear: Tympanic membrane, ear canal and external ear normal. There is no impacted cerumen.     Left Ear: Tympanic membrane, ear canal and external ear normal. There is no impacted cerumen.     Nose: Nose normal. No congestion or rhinorrhea.     Mouth/Throat:     Mouth: Mucous membranes are moist.     Pharynx: No oropharyngeal exudate or posterior oropharyngeal erythema.  Eyes:     General: No scleral icterus.       Right eye: No discharge.        Left eye: No discharge.     Extraocular Movements: Extraocular movements intact.     Conjunctiva/sclera: Conjunctivae normal.     Pupils: Pupils are equal, round,  and reactive to light.  Neck:     Thyroid: No thyromegaly.     Vascular: No carotid bruit or JVD.     Trachea: Trachea normal.  Cardiovascular:     Rate and Rhythm: Normal rate and regular rhythm.     Pulses: Normal pulses.     Heart sounds: Normal heart sounds. No murmur heard.    No friction rub. No gallop.  Pulmonary:     Effort: Pulmonary effort is normal. No respiratory distress.     Breath sounds: Normal breath sounds. No wheezing.  Abdominal:     General: Bowel sounds are normal. There is no distension.     Palpations: Abdomen is soft.     Tenderness: There is no abdominal tenderness. There is no guarding.  Musculoskeletal:        General: Normal range of motion.     Cervical back: Normal range of motion and neck supple.  Lymphadenopathy:     Cervical: No cervical adenopathy.  Skin:    General: Skin is warm and dry.  Neurological:     Mental Status: She is alert and oriented to person, place, and time.     Cranial Nerves: No cranial nerve deficit.  Psychiatric:        Mood and Affect: Mood normal.        Behavior: Behavior normal.        Thought Content: Thought content normal.        Judgment: Judgment normal.      No results found for any visits on 12/26/23.     Assessment & Plan:    Routine Health Maintenance and Physical Exam   There is no immunization history on file for this patient.  Health Maintenance  Topic Date Due   COVID-19 Vaccine (1) 01/11/2024 (Originally 02/22/1981)   INFLUENZA VACCINE  03/01/2024   Cervical Cancer Screening (HPV/Pap Cotest)  10/27/2025   Colonoscopy  11/24/2032   Hepatitis C Screening  Completed   HIV Screening  Completed   HPV VACCINES  Aged Out   Meningococcal B Vaccine  Aged Out   DTaP/Tdap/Td  Discontinued    Discussed health benefits of physical activity, and encouraged her to engage in regular exercise appropriate for her age and condition.  1. Annual physical exam (Primary) Labs done before the appointment and  reviewed. UTD on preventative care. Wellness information included with AVS.   Return in about 1 year (around 12/25/2024) for annual physical exam.     Cherre Cornish, NP

## 2023-12-26 NOTE — Patient Instructions (Signed)
 Preventive Care 16-47 Years Old, Female  Preventive care refers to lifestyle choices and visits with your health care provider that can promote health and wellness. Preventive care visits are also called wellness exams.  What can I expect for my preventive care visit?  Counseling  Your health care provider may ask you questions about your:  Medical history, including:  Past medical problems.  Family medical history.  Pregnancy history.  Current health, including:  Menstrual cycle.  Method of birth control.  Emotional well-being.  Home life and relationship well-being.  Sexual activity and sexual health.  Lifestyle, including:  Alcohol, nicotine or tobacco, and drug use.  Access to firearms.  Diet, exercise, and sleep habits.  Work and work Astronomer.  Sunscreen use.  Safety issues such as seatbelt and bike helmet use.  Physical exam  Your health care provider will check your:  Height and weight. These may be used to calculate your BMI (body mass index). BMI is a measurement that tells if you are at a healthy weight.  Waist circumference. This measures the distance around your waistline. This measurement also tells if you are at a healthy weight and may help predict your risk of certain diseases, such as type 2 diabetes and high blood pressure.  Heart rate and blood pressure.  Body temperature.  Skin for abnormal spots.  What immunizations do I need?    Vaccines are usually given at various ages, according to a schedule. Your health care provider will recommend vaccines for you based on your age, medical history, and lifestyle or other factors, such as travel or where you work.  What tests do I need?  Screening  Your health care provider may recommend screening tests for certain conditions. This may include:  Lipid and cholesterol levels.  Diabetes screening. This is done by checking your blood sugar (glucose) after you have not eaten for a while (fasting).  Pelvic exam and Pap test.  Hepatitis B test.  Hepatitis C  test.  HIV (human immunodeficiency virus) test.  STI (sexually transmitted infection) testing, if you are at risk.  Lung cancer screening.  Colorectal cancer screening.  Mammogram. Talk with your health care provider about when you should start having regular mammograms. This may depend on whether you have a family history of breast cancer.  BRCA-related cancer screening. This may be done if you have a family history of breast, ovarian, tubal, or peritoneal cancers.  Bone density scan. This is done to screen for osteoporosis.  Talk with your health care provider about your test results, treatment options, and if necessary, the need for more tests.  Follow these instructions at home:  Eating and drinking    Eat a diet that includes fresh fruits and vegetables, whole grains, lean protein, and low-fat dairy products.  Take vitamin and mineral supplements as recommended by your health care provider.  Do not drink alcohol if:  Your health care provider tells you not to drink.  You are pregnant, may be pregnant, or are planning to become pregnant.  If you drink alcohol:  Limit how much you have to 0-1 drink a day.  Know how much alcohol is in your drink. In the [place] one drink equals one 12 oz bottle of beer (355 mL), one 5 oz glass of wine (148 mL), or one 1 oz glass of hard liquor (44 mL).  Lifestyle  Brush your teeth every morning and night with fluoride toothpaste. Floss one time each day.  Exercise for at least  30 minutes 5 or more days each week.  Do not use any products that contain nicotine or tobacco. These products include cigarettes, chewing tobacco, and vaping devices, such as e-cigarettes. If you need help quitting, ask your health care provider.  Do not use drugs.  If you are sexually active, practice safe sex. Use a condom or other form of protection to prevent STIs.  If you do not wish to become pregnant, use a form of birth control. If you plan to become pregnant, see your health care provider for a  prepregnancy visit.  Take aspirin only as told by your health care provider. Make sure that you understand how much to take and what form to take. Work with your health care provider to find out whether it is safe and beneficial for you to take aspirin daily.  Find healthy ways to manage stress, such as:  Meditation, yoga, or listening to music.  Journaling.  Talking to a trusted person.  Spending time with friends and family.  Minimize exposure to UV radiation to reduce your risk of skin cancer.  Safety  Always wear your seat belt while driving or riding in a vehicle.  Do not drive:  If you have been drinking alcohol. Do not ride with someone who has been drinking.  When you are tired or distracted.  While texting.  If you have been using any mind-altering substances or drugs.  Wear a helmet and other protective equipment during sports activities.  If you have firearms in your house, make sure you follow all gun safety procedures.  Seek help if you have been physically or sexually abused.  What's next?  Visit your health care provider once a year for an annual wellness visit.  Ask your health care provider how often you should have your eyes and teeth checked.  Stay up to date on all vaccines.  This information is not intended to replace advice given to you by your health care provider. Make sure you discuss any questions you have with your health care provider.  Document Revised: 01/13/2021 Document Reviewed: 01/13/2021  Elsevier Patient Education  2024 [employer].

## 2024-01-30 ENCOUNTER — Encounter: Payer: Self-pay | Admitting: Medical-Surgical

## 2024-01-30 DIAGNOSIS — Z202 Contact with and (suspected) exposure to infections with a predominantly sexual mode of transmission: Secondary | ICD-10-CM

## 2024-01-31 DIAGNOSIS — Z202 Contact with and (suspected) exposure to infections with a predominantly sexual mode of transmission: Secondary | ICD-10-CM | POA: Diagnosis not present

## 2024-02-01 ENCOUNTER — Ambulatory Visit: Payer: Self-pay | Admitting: Medical-Surgical

## 2024-02-01 LAB — HSV 1 AND 2 AB, IGG
HSV 1 Glycoprotein G Ab, IgG: NONREACTIVE
HSV 2 IgG, Type Spec: NONREACTIVE

## 2024-02-14 ENCOUNTER — Ambulatory Visit (INDEPENDENT_AMBULATORY_CARE_PROVIDER_SITE_OTHER): Admitting: Medical-Surgical

## 2024-02-14 VITALS — BP 101/60 | HR 61 | Resp 20 | Ht 69.5 in | Wt 155.0 lb

## 2024-02-14 DIAGNOSIS — F902 Attention-deficit hyperactivity disorder, combined type: Secondary | ICD-10-CM

## 2024-02-14 DIAGNOSIS — B001 Herpesviral vesicular dermatitis: Secondary | ICD-10-CM | POA: Insufficient documentation

## 2024-02-14 DIAGNOSIS — K120 Recurrent oral aphthae: Secondary | ICD-10-CM | POA: Diagnosis not present

## 2024-02-14 MED ORDER — LISDEXAMFETAMINE DIMESYLATE 50 MG PO CAPS: 50.0000 mg | ORAL_CAPSULE | Freq: Every day | ORAL | 0 refills | Status: DC

## 2024-02-14 MED ORDER — VALACYCLOVIR HCL 1 G PO TABS: 2000.0000 mg | ORAL_TABLET | Freq: Two times a day (BID) | ORAL | 0 refills | Status: AC

## 2024-02-14 NOTE — Patient Instructions (Signed)
Cold Sore  A cold sore, also called a fever blister, is a small, fluid-filled sore that forms inside the mouth or on the lips, gums, nose, chin, or cheeks. Cold sores can spread to other parts of the body, such as the eyes, fingers, or genitals. Cold sores can spread from person to person (are contagious) until the sores crust over completely. Most cold sores go away within 2 weeks. What are the causes? Cold sores are caused by an infection from a common type of herpes simplex virus (HSV-1). HSV-1 is closely related to the HSV-2virus, which is the virus that causes genital herpes, but these viruses are not the same. Once a person is infected with HSV-1, the virus remains permanently in the body. HSV-1 is spread from person to person through close contact, such as through kissing, touching the affected area, or sharing personal items such as lip balm, razors, a drinking glass, or eating utensils. What increases the risk? You are more likely to develop this condition if you: Are tired, stressed, or sick. Are menstruating. Are pregnant. Take certain medicines. Are exposed to cold weather or too much sun. What are the signs or symptoms? Symptoms of a cold sore outbreak go through different stages. These are the stages of a cold sore: Tingling, itching, or burning is felt 1-2 days before the outbreak. Fluid-filled blisters appear on the lips, inside the mouth, on the nose, or on the cheeks. The blisters start to ooze clear fluid. The blisters dry up, and a yellow crust appears in their place. The crust falls off. In some cases, other symptoms can develop during a cold sore outbreak. These can include: Fever. Sore throat. Headache. Muscle aches. Swollen neck glands. How is this diagnosed? This condition is diagnosed based on your medical history and a physical exam. Your health care provider may do a blood test or may swab some fluid from your sore and then examine the swab in the lab. How is  this treated? There is no cure for cold sores or HSV-1. There is also no vaccine for HSV-1. Most cold sores go away on their own without treatment within 2 weeks. Medicines cannot make the infection go away, but your health care provider may prescribe medicines to: Help relieve some of the pain associated with the sores. Work to stop the virus from multiplying. Shorten healing time. Medicines may be in the form of creams, gels, pills, or a shot. Follow these instructions at home: Medicines Take or apply over-the-counter and prescription medicines only as told by your health care provider. Use a cotton-tip swab to apply creams or gels to your sores. Ask your health care provider if you can take lysine supplements. Research has found that lysine may help heal the cold sore faster and prevent outbreaks. Sore care  Do not touch the sores or pick the scabs. Wash your hands often with soap and water for at least 20 seconds. Do not touch your eyes without washing your hands first. Keep the sores clean and dry. If directed, put ice on the sores. To do this: Put ice in a plastic bag. Place a towel between your skin and the bag. Leave the ice on for 20 minutes, 2-3 times a day. Remove the ice if your skin turns bright red. This is very important. If you cannot feel pain, heat, or cold, you have a greater risk of damage to the area. Eating and drinking Eat a soft, bland diet. Avoid eating hot, cold, or salty foods.  Use a straw if it hurts to drink out of a glass. Eat foods that are rich in lysine, such as meat, fish, and dairy products. Avoid sugary foods, chocolates, nuts, and grains. These foods are rich in a nutrient called arginine, which can cause the virus to multiply. Lifestyle Do not kiss, have oral sex, or share personal items until your sores heal. Stress, poor sleep, and being out in the sun can trigger outbreaks. Make sure you: Do activities that help you relax, such as deep breathing  exercises or meditation. Get enough sleep. Apply sunscreen on your lips before you go out in the sun. Contact a health care provider if: You have symptoms for more than 2 weeks. You have pus coming from the sores. You have redness that is spreading. You have pain or irritation in your eye. You get sores on your genitals. Your sores do not heal within 2 weeks. You have frequent cold sore outbreaks. Get help right away if: You have a fever and your symptoms suddenly get worse. You have a headache and confusion. You have tiredness (fatigue) or loss of appetite. You have a stiff neck or sensitivity to light. Summary A cold sore, also called a fever blister, is a small, fluid-filled sore that forms inside the mouth or on the lips, gums, nose, chin, or cheeks. Most cold sores go away on their own without treatment within 2 weeks. Your health care provider may prescribe medicines to help relieve some of the pain, work to stop the virus from multiplying, and shorten healing time. Wash your hands often with soap and water for at least 20 seconds. Do not touch your eyes without washing your hands first. Do not kiss, have oral sex, or share personal items until your sores heal. Contact a health care provider if your sores do not heal within 2 weeks. This information is not intended to replace advice given to you by your health care provider. Make sure you discuss any questions you have with your health care provider. Document Revised: 04/28/2021 Document Reviewed: 04/28/2021 Elsevier Patient Education  2024 [employer].

## 2024-02-14 NOTE — Progress Notes (Signed)
        Established patient visit  Discussed the use of AI scribe software for clinical note transcription with the patient, who gave verbal consent to proceed.  History of Present Illness   Amber Lawrence is a 48 year old female who presents with a recent severe outbreak of cold sores.  Recurrent herpetic labialis (cold sores) - Severe outbreak in June 2025, coinciding with significant personal stress - Lesions initially appeared on the lip and spread above the lip - Outbreaks typically occur every three to four years - Stress is a common trigger for outbreaks - Avoids certain lip products that can trigger outbreaks - Multiple tests for HSV-1 have been consistently negative - Family history of cold sores in her father and grandfather  Recurrent aphthous stomatitis (canker sores) - Occasional large, painful canker sores inside the mouth, especially after accidental biting - Dentist has observed these lesions without identifying a specific cause  Current and prior treatments - Uses Abreva for cold sore outbreaks - Takes lysine supplements      Physical Exam Vitals reviewed.  Constitutional:      General: She is not in acute distress.    Appearance: Normal appearance. She is not ill-appearing.  HENT:     Head: Normocephalic and atraumatic.  Cardiovascular:     Rate and Rhythm: Normal rate and regular rhythm.     Pulses: Normal pulses.     Heart sounds: Normal heart sounds. No murmur heard.    No friction rub. No gallop.  Pulmonary:     Effort: Pulmonary effort is normal. No respiratory distress.     Breath sounds: Normal breath sounds. No wheezing.  Skin:    General: Skin is warm and dry.  Neurological:     Mental Status: She is alert and oriented to person, place, and time.  Psychiatric:        Mood and Affect: Mood normal.        Behavior: Behavior normal.        Thought Content: Thought content normal.        Judgment: Judgment normal.     Assessment and  Plan    Herpes Labialis (Cold Sores) Recurrent herpes labialis, triggered by stress, consistent with clinical presentation despite negative HSV-1 tests. Valtrex  effective for acute treatment. - Prescribe Valtrex  2 grams twice a day for one day for acute outbreaks. - Educated on Valtrex 's potential to reduce outbreak duration and severity. - Discussed suppressive therapy with Valtrex  500 mg daily if outbreaks become more frequent.  Recurrent Aphthous Stomatitis (Canker Sores) Recurrent canker sores with no systemic condition identified. Focus on symptomatic relief and trigger avoidance. - Recommend avoiding known triggers such as certain lip products.  ADHD (Attention-Deficit/Hyperactivity Disorder) ADHD previously managed with Vyvanse  but was too costly so switched to Concerta . Now Vyvanse  is preferred due to cost changes and insurance coverage. - Discontinue Concerta  - Prescribe Vyvanse  as per her preference and insurance coverage. - Discussed cost and insurance coverage for Vyvanse .      Return in about 3 months (around 05/16/2024).  __________________________________ Zada FREDRIK Palin, DNP, APRN, FNP-BC Primary Care and Sports Medicine [facility] [place]

## 2024-02-15 ENCOUNTER — Encounter: Payer: Self-pay | Admitting: Medical-Surgical

## 2024-02-15 MED ORDER — LISDEXAMFETAMINE DIMESYLATE 50 MG PO CAPS: 50.0000 mg | ORAL_CAPSULE | Freq: Every day | ORAL | 0 refills | Status: DC

## 2024-02-15 NOTE — Telephone Encounter (Signed)
 Pended prescription

## 2024-02-23 ENCOUNTER — Other Ambulatory Visit: Payer: Self-pay | Admitting: Medical-Surgical

## 2024-02-27 ENCOUNTER — Ambulatory Visit: Admitting: Medical-Surgical

## 2024-04-02 ENCOUNTER — Encounter: Payer: Self-pay | Admitting: Sports Medicine

## 2024-04-12 ENCOUNTER — Other Ambulatory Visit: Payer: Self-pay | Admitting: Medical Genetics

## 2024-04-19 ENCOUNTER — Other Ambulatory Visit (INDEPENDENT_AMBULATORY_CARE_PROVIDER_SITE_OTHER): Payer: Self-pay

## 2024-04-19 ENCOUNTER — Encounter (INDEPENDENT_AMBULATORY_CARE_PROVIDER_SITE_OTHER): Payer: Self-pay | Admitting: Medical-Surgical

## 2024-04-19 ENCOUNTER — Ambulatory Visit

## 2024-04-19 DIAGNOSIS — Z202 Contact with and (suspected) exposure to infections with a predominantly sexual mode of transmission: Secondary | ICD-10-CM

## 2024-04-19 MED ORDER — DOXYCYCLINE HYCLATE 100 MG PO TABS: 100.0000 mg | ORAL_TABLET | Freq: Two times a day (BID) | ORAL | 0 refills | Status: AC

## 2024-04-19 MED ORDER — CEFTRIAXONE SODIUM 500 MG IJ SOLR
500.0000 mg | Freq: Once | INTRAMUSCULAR | Status: AC
  Administered 2024-04-19: 500 mg via INTRAMUSCULAR

## 2024-04-19 NOTE — Telephone Encounter (Signed)
Please see the MyChart message reply(ies) for my assessment and plan.    This patient gave consent for this Medical Advice Message and is aware that it may result in a bill to their insurance company, as well as the possibility of receiving a bill for a co-payment or deductible. They are an established patient, but are not seeking medical advice exclusively about a problem treated during an in person or video visit in the last seven days. I did not recommend an in person or video visit within seven days of my reply.    I spent a total of 5 minutes cumulative time within 7 days through MyChart messaging.  Keniesha Adderly, NP   

## 2024-04-19 NOTE — Progress Notes (Signed)
 Patient here for Rocephin  injection for possible STD exposure.  Location: [facility]

## 2024-04-23 ENCOUNTER — Ambulatory Visit: Payer: Self-pay | Admitting: Medical-Surgical

## 2024-04-23 LAB — STI PROFILE, CT/NG/TV
Gonococcus by NAA: POSITIVE — AB
HCV Ab: NONREACTIVE
HIV Screen 4th Generation wRfx: NONREACTIVE
Hep B Core Total Ab: NEGATIVE
Hep B Surface Ab, Qual: NONREACTIVE
Hepatitis B Surface Ag: NEGATIVE
RPR Ser Ql: NONREACTIVE

## 2024-04-23 LAB — HCV INTERPRETATION

## 2024-05-07 DIAGNOSIS — F4323 Adjustment disorder with mixed anxiety and depressed mood: Secondary | ICD-10-CM | POA: Diagnosis not present

## 2024-05-15 NOTE — Progress Notes (Unsigned)
        Established patient visit   History of Present Illness   Discussed the use of AI scribe software for clinical note transcription with the patient, who gave verbal consent to proceed.  History of Present Illness   Amber Lawrence is a 48 year old female who presents for a follow-up regarding her medication management and recent physical exertion concerns.  Stimulant medication management - Currently taking Vyvanse  50 mg, well tolerated - Medication effects diminish by 1 PM, resulting in decreased focus in the afternoon - Considering an additional dose for sustained focus - No difficulty sleeping, appetite suppression, or weight fluctuations  Physical Exam   Physical Exam Vitals reviewed.  Constitutional:      General: She is not in acute distress.    Appearance: Normal appearance. She is not ill-appearing.  HENT:     Head: Normocephalic and atraumatic.  Cardiovascular:     Rate and Rhythm: Normal rate and regular rhythm.     Pulses: Normal pulses.     Heart sounds: Normal heart sounds. No murmur heard.    No friction rub. No gallop.  Pulmonary:     Effort: Pulmonary effort is normal. No respiratory distress.     Breath sounds: Normal breath sounds. No wheezing.  Skin:    General: Skin is warm and dry.  Neurological:     Mental Status: She is alert and oriented to person, place, and time.  Psychiatric:        Mood and Affect: Mood normal.        Behavior: Behavior normal.        Thought Content: Thought content normal.        Judgment: Judgment normal.    Assessment & Plan   Problem List Items Addressed This Visit       Other   Attention deficit hyperactivity disorder (ADHD), combined type - Primary   Vyvanse  50 mg effective until 1 PM. Evaluating afternoon symptom management options. - Continue Vyvanse  50mg  daily. - Adding Ritalin  10mg  daily as needed in the afternoon. - If not helpful, plan to discontinue Ritalin  and increase Vyvanse  to 60mg  daily.       Relevant Medications   lisdexamfetamine (VYVANSE ) 50 MG capsule (Start on 07/15/2024)   lisdexamfetamine (VYVANSE ) 50 MG capsule (Start on 06/15/2024)   lisdexamfetamine (VYVANSE ) 50 MG capsule      Follow up   Return in about 3 months (around 08/16/2024) for ADHD follow up. __________________________________ Zada FREDRIK Palin, DNP, APRN, FNP-BC Primary Care and Sports Medicine [facility]

## 2024-05-16 ENCOUNTER — Ambulatory Visit: Admitting: Medical-Surgical

## 2024-05-16 ENCOUNTER — Encounter: Payer: Self-pay | Admitting: Medical-Surgical

## 2024-05-16 VITALS — BP 103/61 | HR 52 | Resp 20 | Ht 69.5 in | Wt 159.8 lb

## 2024-05-16 DIAGNOSIS — F902 Attention-deficit hyperactivity disorder, combined type: Secondary | ICD-10-CM

## 2024-05-16 MED ORDER — METHYLPHENIDATE HCL 10 MG PO TABS: 10.0000 mg | ORAL_TABLET | Freq: Every day | ORAL | 0 refills | Status: DC

## 2024-05-16 MED ORDER — LISDEXAMFETAMINE DIMESYLATE 50 MG PO CAPS: 50.0000 mg | ORAL_CAPSULE | Freq: Every day | ORAL | 0 refills | Status: DC

## 2024-05-16 NOTE — Assessment & Plan Note (Signed)
 Vyvanse  50 mg effective until 1 PM. Evaluating afternoon symptom management options. - Continue Vyvanse  50mg  daily. - Adding Ritalin  10mg  daily as needed in the afternoon. - If not helpful, plan to discontinue Ritalin  and increase Vyvanse  to 60mg  daily.

## 2024-05-24 ENCOUNTER — Other Ambulatory Visit: Payer: Self-pay | Admitting: Medical-Surgical

## 2024-05-28 DIAGNOSIS — F4323 Adjustment disorder with mixed anxiety and depressed mood: Secondary | ICD-10-CM | POA: Diagnosis not present

## 2024-06-04 DIAGNOSIS — F4323 Adjustment disorder with mixed anxiety and depressed mood: Secondary | ICD-10-CM | POA: Diagnosis not present

## 2024-06-13 DIAGNOSIS — F4323 Adjustment disorder with mixed anxiety and depressed mood: Secondary | ICD-10-CM | POA: Diagnosis not present

## 2024-07-02 ENCOUNTER — Other Ambulatory Visit (HOSPITAL_COMMUNITY)
Admission: RE | Admit: 2024-07-02 | Discharge: 2024-07-02 | Disposition: A | Payer: Self-pay | Source: Ambulatory Visit | Attending: Medical Genetics | Admitting: Medical Genetics

## 2024-07-03 ENCOUNTER — Encounter: Payer: Self-pay | Admitting: Medical-Surgical

## 2024-07-04 ENCOUNTER — Other Ambulatory Visit: Payer: Self-pay | Admitting: Medical-Surgical

## 2024-07-04 MED ORDER — METHYLPHENIDATE HCL 10 MG PO TABS: 10.0000 mg | ORAL_TABLET | Freq: Every day | ORAL | 0 refills | Status: DC

## 2024-07-05 DIAGNOSIS — F4323 Adjustment disorder with mixed anxiety and depressed mood: Secondary | ICD-10-CM | POA: Diagnosis not present

## 2024-07-12 LAB — GENECONNECT MOLECULAR SCREEN: Genetic Analysis Overall Interpretation: NEGATIVE

## 2024-07-18 DIAGNOSIS — F4323 Adjustment disorder with mixed anxiety and depressed mood: Secondary | ICD-10-CM | POA: Diagnosis not present

## 2024-08-15 ENCOUNTER — Ambulatory Visit: Admitting: Medical-Surgical

## 2024-08-15 ENCOUNTER — Encounter: Payer: Self-pay | Admitting: Medical-Surgical

## 2024-08-15 VITALS — BP 115/72 | HR 59 | Temp 97.8°F | Resp 16 | Ht 69.5 in | Wt 159.1 lb

## 2024-08-15 DIAGNOSIS — F902 Attention-deficit hyperactivity disorder, combined type: Secondary | ICD-10-CM | POA: Diagnosis not present

## 2024-08-15 DIAGNOSIS — G4700 Insomnia, unspecified: Secondary | ICD-10-CM | POA: Diagnosis not present

## 2024-08-15 NOTE — Progress Notes (Signed)
" ° °       Established patient visit   History of Present Illness   Discussed the use of AI scribe software for clinical note transcription with the patient, who gave verbal consent to proceed.  History of Present Illness   Amber Lawrence is a 49 year old female who presents for medication management and follow-up.  Attention deficit symptoms and stimulant medication use - Vyvanse  50 mg taken at 7 AM and Ritalin  10 mg taken around noon to 1 PM provide effective symptom control for work. - Decreased appetite associated with stimulant use, often requiring effort to eat. - Desires to continue current stimulant regimen due to functional benefit.  Mixed insomnia - Uses Seroquel  as needed and finds it effective.  Psychotherapy and emotional support - Engaged in therapy and undergoing EMDR, which is perceived as helpful. - Supportive relationship with partner.  Physical Exam   Physical Exam Vitals reviewed.  Constitutional:      General: She is not in acute distress.    Appearance: Normal appearance. She is not ill-appearing.  HENT:     Head: Normocephalic and atraumatic.  Cardiovascular:     Rate and Rhythm: Normal rate and regular rhythm.     Pulses: Normal pulses.     Heart sounds: Normal heart sounds. No murmur heard.    No friction rub. No gallop.  Pulmonary:     Effort: Pulmonary effort is normal. No respiratory distress.     Breath sounds: Normal breath sounds. No wheezing.  Skin:    General: Skin is warm and dry.  Neurological:     Mental Status: She is alert and oriented to person, place, and time.  Psychiatric:        Mood and Affect: Mood normal.        Behavior: Behavior normal.        Thought Content: Thought content normal.        Judgment: Judgment normal.    Assessment & Plan   Attention-deficit hyperactivity disorder, combined type ADHD well-managed with Vyvanse  50 mg and Ritalin  10 mg. Prefers 90-day prescription renewals. - Sent 90-day Vyvanse  and  Ritalin  prescription to [facility], [place]. - Coordinated with Dr. Alvia for prescription signing.  Mixed insomnia Managed with Seroquel  as needed, effective. Infrequent dosing. - Continue Seroquel  25mg  nightly as needed.     Follow up   Return in about 6 months (around 02/12/2025) for ADHD follow up. __________________________________ Zada FREDRIK Palin, DNP, APRN, FNP-BC Primary Care and Sports Medicine [facility] "

## 2024-08-16 MED ORDER — METHYLPHENIDATE HCL 10 MG PO TABS: 10.0000 mg | ORAL_TABLET | Freq: Every day | ORAL | 0 refills | Status: DC

## 2024-08-16 MED ORDER — LISDEXAMFETAMINE DIMESYLATE 50 MG PO CAPS: 50.0000 mg | ORAL_CAPSULE | Freq: Every day | ORAL | 0 refills | Status: DC

## 2024-08-19 ENCOUNTER — Encounter: Payer: Self-pay | Admitting: Medical-Surgical

## 2024-08-19 ENCOUNTER — Other Ambulatory Visit: Payer: Self-pay | Admitting: Family Medicine

## 2024-08-19 DIAGNOSIS — F902 Attention-deficit hyperactivity disorder, combined type: Secondary | ICD-10-CM

## 2024-08-19 MED ORDER — LISDEXAMFETAMINE DIMESYLATE 50 MG PO CAPS: 50.0000 mg | ORAL_CAPSULE | Freq: Every day | ORAL | 0 refills | Status: DC

## 2024-08-19 MED ORDER — METHYLPHENIDATE HCL 10 MG PO TABS: 10.0000 mg | ORAL_TABLET | Freq: Every day | ORAL | 0 refills | Status: DC

## 2024-08-19 NOTE — Progress Notes (Signed)
 Meds ordered this encounter  Medications   methylphenidate  (RITALIN ) 10 MG tablet    Sig: Take 1 tablet (10 mg total) by mouth daily in the afternoon.    Dispense:  90 tablet    Refill:  0   lisdexamfetamine  (VYVANSE ) 50 MG capsule    Sig: Take 1 capsule (50 mg total) by mouth daily before breakfast.    Dispense:  90 capsule    Refill:  0

## 2024-08-20 MED ORDER — QUETIAPINE FUMARATE 25 MG PO TABS: 25.0000 mg | ORAL_TABLET | Freq: Every day | ORAL | 0 refills | Status: AC

## 2024-08-21 MED ORDER — METHYLPHENIDATE HCL 10 MG PO TABS: 10.0000 mg | ORAL_TABLET | Freq: Every day | ORAL | 0 refills | Status: AC

## 2024-08-21 MED ORDER — LISDEXAMFETAMINE DIMESYLATE 50 MG PO CAPS: 50.0000 mg | ORAL_CAPSULE | Freq: Every day | ORAL | 0 refills | Status: AC

## 2024-08-23 ENCOUNTER — Other Ambulatory Visit: Payer: Self-pay | Admitting: Medical-Surgical

## 2024-08-29 ENCOUNTER — Ambulatory Visit: Admitting: Medical-Surgical

## 2024-08-29 VITALS — BP 104/65 | HR 59 | Temp 97.8°F | Ht 69.5 in | Wt 158.7 lb

## 2024-08-29 DIAGNOSIS — R6889 Other general symptoms and signs: Secondary | ICD-10-CM | POA: Diagnosis not present

## 2024-08-29 LAB — POC COVID19/FLU A&B COMBO
Covid Antigen, POC: NEGATIVE
Influenza A Antigen, POC: NEGATIVE
Influenza B Antigen, POC: NEGATIVE

## 2024-08-29 MED ORDER — PREDNISONE 20 MG PO TABS: 40.0000 mg | ORAL_TABLET | Freq: Every day | ORAL | 0 refills | Status: AC

## 2024-08-29 MED ORDER — HYDROCODONE BIT-HOMATROP MBR 5-1.5 MG/5ML PO SOLN: 5.0000 mL | Freq: Three times a day (TID) | ORAL | 0 refills | Status: AC | PRN

## 2024-08-29 NOTE — Progress Notes (Signed)
" ° °       Established patient visit   History of Present Illness   Discussed the use of AI scribe software for clinical note transcription with the patient, who gave verbal consent to proceed.  History of Present Illness   Amber Lawrence is a 49 year old female who presents with persistent cough and nasal congestion.  Cough and upper respiratory symptoms - Persistent non-productive cough for three days, worse at night and disruptive to sleep - Requires sleeping sitting up due to cough - Temporary relief with Mucinex, Sudafed, and Tylenol Cold and Sinus - Headache and sinus pressure present - Pain with swallowing, throat sore and no phlegm - Body aches today, possibly related to recent workouts   Physical Exam   Physical Exam Vitals reviewed.  Constitutional:      General: She is not in acute distress.    Appearance: Normal appearance.  HENT:     Head: Normocephalic and atraumatic.  Cardiovascular:     Rate and Rhythm: Normal rate and regular rhythm.     Pulses: Normal pulses.     Heart sounds: Normal heart sounds. No murmur heard.    No friction rub. No gallop.  Pulmonary:     Effort: Pulmonary effort is normal. No respiratory distress.     Breath sounds: Normal breath sounds. No wheezing.  Skin:    General: Skin is warm and dry.  Neurological:     Mental Status: She is alert and oriented to person, place, and time.  Psychiatric:        Mood and Affect: Mood normal.        Behavior: Behavior normal.        Thought Content: Thought content normal.        Judgment: Judgment normal.    Assessment & Plan   Flu-like symptoms Symptoms and duration suggest viral etiology. Low likelihood of bacterial infection at this time. Antibiotics not indicated.  - Prescribed Hycodan cough syrup 5ml every 8 hours as needed for sleep. - Start Prednisone  40mg  daily x 5 days.  - Continue OTC meds and home remedies as needed.  - If symptoms persist greater than 7-10 days or if she  experiences resickening, consider antibiotics.    Follow up   Return if symptoms worsen or fail to improve. __________________________________ Zada FREDRIK Palin, DNP, APRN, FNP-BC Primary Care and Sports Medicine [facility] [place] "

## 2024-08-29 NOTE — Addendum Note (Signed)
 Addended by: FANNY NIELS CROME on: 08/29/2024 09:43 AM   Modules accepted: Orders

## 2024-09-04 ENCOUNTER — Encounter: Payer: Self-pay | Admitting: Medical-Surgical

## 2024-09-04 MED ORDER — AMOXICILLIN-POT CLAVULANATE 875-125 MG PO TABS: 1.0000 | ORAL_TABLET | Freq: Two times a day (BID) | ORAL | 0 refills | Status: AC

## 2024-09-04 MED ORDER — AMOXICILLIN-POT CLAVULANATE 875-125 MG PO TABS: 1.0000 | ORAL_TABLET | Freq: Two times a day (BID) | ORAL | 0 refills | Status: DC

## 2025-02-12 ENCOUNTER — Ambulatory Visit: Admitting: Medical-Surgical
